# Patient Record
Sex: Female | Born: 1990
Health system: Southern US, Community
[De-identification: ages and names within clinical notes are randomized; demographics above are authoritative.]

## PROBLEM LIST (undated history)

## (undated) DIAGNOSIS — F419 Anxiety disorder, unspecified: Secondary | ICD-10-CM

## (undated) DIAGNOSIS — K219 Gastro-esophageal reflux disease without esophagitis: Secondary | ICD-10-CM

## (undated) DIAGNOSIS — I10 Essential (primary) hypertension: Secondary | ICD-10-CM

## (undated) HISTORY — DX: Essential (primary) hypertension: I10

---
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
## 2009-01-23 ENCOUNTER — Inpatient Hospital Stay: Payer: Self-pay | Admitting: Obstetrics and Gynecology

## 2009-06-13 ENCOUNTER — Emergency Department: Payer: Self-pay | Admitting: Emergency Medicine

## 2014-08-26 DIAGNOSIS — E669 Obesity, unspecified: Secondary | ICD-10-CM | POA: Insufficient documentation

## 2014-11-25 ENCOUNTER — Encounter: Payer: Self-pay | Admitting: Medical Oncology

## 2014-11-25 ENCOUNTER — Emergency Department
Admission: EM | Admit: 2014-11-25 | Discharge: 2014-11-25 | Disposition: A | Discharge status: Home / Self Care | Payer: BLUE CROSS/BLUE SHIELD | Attending: Emergency Medicine | Admitting: Emergency Medicine

## 2014-11-25 DIAGNOSIS — Z3202 Encounter for pregnancy test, result negative: Secondary | ICD-10-CM | POA: Insufficient documentation

## 2014-11-25 DIAGNOSIS — Z720 Tobacco use: Secondary | ICD-10-CM | POA: Diagnosis not present

## 2014-11-25 DIAGNOSIS — R110 Nausea: Secondary | ICD-10-CM | POA: Insufficient documentation

## 2014-11-25 DIAGNOSIS — R1030 Lower abdominal pain, unspecified: Secondary | ICD-10-CM | POA: Diagnosis not present

## 2014-11-25 DIAGNOSIS — R109 Unspecified abdominal pain: Secondary | ICD-10-CM

## 2014-11-25 HISTORY — DX: Gastro-esophageal reflux disease without esophagitis: K21.9

## 2014-11-25 LAB — COMPREHENSIVE METABOLIC PANEL
ALT: 11 U/L — ABNORMAL LOW (ref 14–54)
AST: 19 U/L (ref 15–41)
Albumin: 4.7 g/dL (ref 3.5–5.0)
Alkaline Phosphatase: 125 U/L (ref 38–126)
Anion gap: 8 (ref 5–15)
BUN: 8 mg/dL (ref 6–20)
CALCIUM: 9.2 mg/dL (ref 8.9–10.3)
CO2: 24 mmol/L (ref 22–32)
Chloride: 105 mmol/L (ref 101–111)
Creatinine, Ser: 0.73 mg/dL (ref 0.44–1.00)
GFR calc Af Amer: >60 mL/min (ref >60)
Glucose, Bld: 83 mg/dL (ref 65–99)
Potassium: 3.7 mmol/L (ref 3.5–5.1)
Sodium: 137 mmol/L (ref 135–145)
TOTAL PROTEIN: 8.3 g/dL — AB (ref 6.5–8.1)
Total Bilirubin: 1 mg/dL (ref 0.3–1.2)

## 2014-11-25 LAB — URINALYSIS COMPLETE WITH MICROSCOPIC (ARMC ONLY)
BILIRUBIN URINE: NEGATIVE
Glucose, UA: NEGATIVE mg/dL
Nitrite: NEGATIVE
PH: 7 (ref 5.0–8.0)
Protein, ur: 30 mg/dL — AB
SPECIFIC GRAVITY, URINE: 1.026 (ref 1.005–1.030)

## 2014-11-25 LAB — CBC WITH DIFFERENTIAL/PLATELET
BASOS PCT: 1 %
Basophils Absolute: 0 10*3/uL (ref 0–0.1)
EOS PCT: 1 %
Eosinophils Absolute: 0.1 10*3/uL (ref 0–0.7)
HEMATOCRIT: 41.6 % (ref 35.0–47.0)
Hemoglobin: 13.7 g/dL (ref 12.0–16.0)
LYMPHS PCT: 34 %
Lymphs Abs: 2.1 10*3/uL (ref 1.0–3.6)
MCH: 28.2 pg (ref 26.0–34.0)
MCHC: 33 g/dL (ref 32.0–36.0)
MCV: 85.5 fL (ref 80.0–100.0)
MONO ABS: 0.6 10*3/uL (ref 0.2–0.9)
Monocytes Relative: 9 %
NEUTROS ABS: 3.4 10*3/uL (ref 1.4–6.5)
Neutrophils Relative %: 55 %
Platelets: 274 10*3/uL (ref 150–440)
RBC: 4.86 MIL/uL (ref 3.80–5.20)
RDW: 12.5 % (ref 11.5–14.5)
WBC: 6.2 10*3/uL (ref 3.6–11.0)

## 2014-11-25 MED ORDER — DICYCLOMINE HCL 10 MG PO CAPS: 20.0000 mg | ORAL_CAPSULE | Freq: Once | ORAL | Status: AC

## 2014-11-25 MED ORDER — DICYCLOMINE HCL 10 MG PO CAPS: 10.0000 mg | ORAL_CAPSULE | Freq: Once | ORAL | Status: DC

## 2014-11-25 MED ORDER — DICYCLOMINE HCL 20 MG PO TABS: 20.0000 mg | ORAL_TABLET | Freq: Three times a day (TID) | ORAL | Status: DC | PRN

## 2014-11-25 MED ORDER — DICYCLOMINE HCL 20 MG PO TABS
ORAL_TABLET | ORAL | Status: AC
  Administered 2014-11-25: 20 mg
  Filled 2014-11-25: qty 1

## 2014-11-25 NOTE — ED Notes (Signed)
POCT urine preg = negative

## 2014-11-25 NOTE — ED Notes (Signed)
Called Lab for results of Urinalysis--per Lab test was ran and resulted at 1630. Results not showing in pt's chart. Lab sending paper copy of result to ED Main tube station.

## 2014-11-25 NOTE — Discharge Instructions (Signed)
Please seek medical attention for any high fevers, chest pain, shortness of breath, change in behavior, persistent vomiting, bloody stool or any other new or concerning symptoms.  Abdominal Pain, Women Abdominal (stomach, pelvic, or belly) pain can be caused by many things. It is important to tell your doctor:  The location of the pain.  Does it come and go or is it present all the time?  Are there things that start the pain (eating certain foods, exercise)?  Are there other symptoms associated with the pain (fever, nausea, vomiting, diarrhea)? All of this is helpful to know when trying to find the cause of the pain. CAUSES   Stomach: virus or bacteria infection, or ulcer.  Intestine: appendicitis (inflamed appendix), regional ileitis (Crohn's disease), ulcerative colitis (inflamed colon), irritable bowel syndrome, diverticulitis (inflamed diverticulum of the colon), or cancer of the stomach or intestine.  Gallbladder disease or stones in the gallbladder.  Kidney disease, kidney stones, or infection.  Pancreas infection or cancer.  Fibromyalgia (pain disorder).  Diseases of the female organs:  Uterus: fibroid (non-cancerous) tumors or infection.  Fallopian tubes: infection or tubal pregnancy.  Ovary: cysts or tumors.  Pelvic adhesions (scar tissue).  Endometriosis (uterus lining tissue growing in the pelvis and on the pelvic organs).  Pelvic congestion syndrome (female organs filling up with blood just before the menstrual period).  Pain with the menstrual period.  Pain with ovulation (producing an egg).  Pain with an IUD (intrauterine device, birth control) in the uterus.  Cancer of the female organs.  Functional pain (pain not caused by a disease, may improve without treatment).  Psychological pain.  Depression. DIAGNOSIS  Your doctor will decide the seriousness of your pain by doing an examination.  Blood tests.  X-rays.  Ultrasound.  CT scan  (computed tomography, special type of X-ray).  MRI (magnetic resonance imaging).  Cultures, for infection.  Barium enema (dye inserted in the large intestine, to better view it with X-rays).  Colonoscopy (looking in intestine with a lighted tube).  Laparoscopy (minor surgery, looking in abdomen with a lighted tube).  Major abdominal exploratory surgery (looking in abdomen with a large incision). TREATMENT  The treatment will depend on the cause of the pain.   Many cases can be observed and treated at home.  Over-the-counter medicines recommended by your caregiver.  Prescription medicine.  Antibiotics, for infection.  Birth control pills, for painful periods or for ovulation pain.  Hormone treatment, for endometriosis.  Nerve blocking injections.  Physical therapy.  Antidepressants.  Counseling with a psychologist or psychiatrist.  Minor or major surgery. HOME CARE INSTRUCTIONS   Do not take laxatives, unless directed by your caregiver.  Take over-the-counter pain medicine only if ordered by your caregiver. Do not take aspirin because it can cause an upset stomach or bleeding.  Try a clear liquid diet (broth or water) as ordered by your caregiver. Slowly move to a bland diet, as tolerated, if the pain is related to the stomach or intestine.  Have a thermometer and take your temperature several times a day, and record it.  Bed rest and sleep, if it helps the pain.  Avoid sexual intercourse, if it causes pain.  Avoid stressful situations.  Keep your follow-up appointments and tests, as your caregiver orders.  If the pain does not go away with medicine or surgery, you may try:  Acupuncture.  Relaxation exercises (yoga, meditation).  Group therapy.  Counseling. SEEK MEDICAL CARE IF:   You notice certain foods cause stomach  pain.  Your home care treatment is not helping your pain.  You need stronger pain medicine.  You want your IUD removed.  You  feel faint or lightheaded.  You develop nausea and vomiting.  You develop a rash.  You are having side effects or an allergy to your medicine. SEEK IMMEDIATE MEDICAL CARE IF:   Your pain does not go away or gets worse.  You have a fever.  Your pain is felt only in portions of the abdomen. The right side could possibly be appendicitis. The left lower portion of the abdomen could be colitis or diverticulitis.  You are passing blood in your stools (bright red or black tarry stools, with or without vomiting).  You have blood in your urine.  You develop chills, with or without a fever.  You pass out. MAKE SURE YOU:   Understand these instructions.  Will watch your condition.  Will get help right away if you are not doing well or get worse. Document Released: 05/01/2007 Document Revised: 11/18/2013 Document Reviewed: 05/21/2009 [facility] Patient Information 2015 [place], [place]. This information is not intended to replace advice given to you by your health care provider. Make sure you discuss any questions you have with your health care provider.

## 2014-11-25 NOTE — ED Notes (Signed)
Pt sent over from [facility] for further eval of possible appendicitis.

## 2014-11-25 NOTE — ED Notes (Signed)
Pt reports that she has been having lower abd pain and lower back pain off and on for a few days. Denies dysuria, denies fever. Reports nausea without vomiting/diarrhea.

## 2014-11-25 NOTE — ED Provider Notes (Signed)
[facility] Emergency Department Provider Note  ____________________________________________  Time seen: 9 I have reviewed the triage vital signs and the nursing notes.   HISTORY  Chief Complaint Abdominal Pain and Flank Pain   History limited by: Not Limited   HPI Tina Young is a 24 y.o. female resents to the emergency department today because of lower abdominal pain and cramping. Patient states the symptoms going on over the past couple of days. It is accompanied with some nausea however the patient denies any true vomiting. Patient denies any diarrhea or bloody stool. Patient denies any fever. The patient had similar episodes of pain in the past couple of months. They're usually short lived and lasts only a couple of days. Patient states she has an appetite and would like to eat.     Past Medical History  Diagnosis Date  . GERD (gastroesophageal reflux disease)     There are no active problems to display for this patient.   Past Surgical History  Procedure Laterality Date  . Cesarean section      Current Outpatient Rx  Name  Route  Sig  Dispense  Refill  . dicyclomine (BENTYL) 20 MG tablet   Oral   Take 1 tablet (20 mg total) by mouth 3 (three) times daily as needed for spasms (abd pain).   30 tablet   0     Allergies Review of patient's allergies indicates no known allergies.  No family history on file.  Social History History  Substance Use Topics  . Smoking status: Current Some Day Smoker  . Smokeless tobacco: Not on file  . Alcohol Use: Yes     Comment: socially    Review of Systems  Constitutional: Negative for fever. Cardiovascular: Negative for chest pain. Respiratory: Negative for shortness of breath. Gastrointestinal: Positive for lower abdominal pain and nausea Genitourinary: Negative for dysuria. Musculoskeletal: Negative for back pain. Skin: Negative for rash. Neurological: Negative for headaches,  focal weakness or numbness.  10-point ROS otherwise negative.  ____________________________________________   PHYSICAL EXAM:  VITAL SIGNS: ED Triage Vitals  Enc Vitals Group     BP 11/25/14 1618 136/82 mmHg     Pulse Rate 11/25/14 1618 82     Resp 11/25/14 1618 18     Temp 11/25/14 1618 98.5 F (36.9 C)     Temp Source 11/25/14 1618 Oral     SpO2 11/25/14 1618 100 %     Weight 11/25/14 1618 152 lb (68.947 kg)     Height 11/25/14 1618 5\' 2"  (1.575 m)     Head Cir --      Peak Flow --      Pain Score 11/25/14 1618 8   Constitutional: Alert and oriented. Well appearing and in no distress. Eyes: Conjunctivae are normal. PERRL. Normal extraocular movements. ENT   Head: Normocephalic and atraumatic.   Nose: No congestion/rhinnorhea.   Mouth/Throat: Mucous membranes are moist.   Neck: No stridor. Hematological/Lymphatic/Immunilogical: No cervical lymphadenopathy. Cardiovascular: Normal rate, regular rhythm.  No murmurs, rubs, or gallops. Respiratory: Normal respiratory effort without tachypnea nor retractions. Breath sounds are clear and equal bilaterally. No wheezes/rales/rhonchi. Gastrointestinal: Soft and minimally tender to palpation about the lower abdomen. No rebound, no guarding. Genitourinary: Deferred Musculoskeletal: Normal range of motion in all extremities. No joint effusions.  No lower extremity tenderness nor edema. Neurologic:  Normal speech and language. No gross focal neurologic deficits are appreciated. Speech is normal.  Skin:  Skin is warm, dry and intact.  No rash noted. Psychiatric: Mood and affect are normal. Speech and behavior are normal. Patient exhibits appropriate insight and judgment.  ____________________________________________    LABS (pertinent positives/negatives)  Labs Reviewed  COMPREHENSIVE METABOLIC PANEL - Abnormal; Notable for the following:    Total Protein 8.3 (*)    ALT 11 (*)    All other components within normal  limits  URINE CULTURE  CBC WITH DIFFERENTIAL/PLATELET  URINALYSIS COMPLETEWITH MICROSCOPIC ([facility])   POC URINE PREG, ED     ____________________________________________   EKG  None  ____________________________________________    RADIOLOGY  None  ____________________________________________   PROCEDURES  Procedure(s) performed: None  Critical Care performed: No  ____________________________________________   INITIAL IMPRESSION / ASSESSMENT AND PLAN / ED COURSE  Pertinent labs & imaging results that were available during my care of the patient were reviewed by me and considered in my medical decision making (see chart for details).  Patient presents with lower abdominal cramping for the past 1-2 days. Patient has had similar episodes of pain in the past. Exam is overall reassuring with only mild tenderness without any rebound or guarding. Additionally patient does not have a leukocytosis and the blood work is reassuring. UA without obvious infection, however it was a dirty sample - will send for culture.  Patient states she feels improved after bentyl. Will discharge home with a prescription. Discussed return precautions.  ____________________________________________   FINAL CLINICAL IMPRESSION(S) / ED DIAGNOSES  Final diagnoses:  Abdominal pain, unspecified abdominal location     Nance Pear, MD 11/25/14 2004

## 2015-09-29 DIAGNOSIS — K439 Ventral hernia without obstruction or gangrene: Secondary | ICD-10-CM | POA: Insufficient documentation

## 2016-11-09 LAB — HM PAP SMEAR: HM Pap smear: NEGATIVE

## 2018-03-30 ENCOUNTER — Emergency Department: Payer: Self-pay

## 2018-03-30 ENCOUNTER — Encounter: Payer: Self-pay | Admitting: Emergency Medicine

## 2018-03-30 ENCOUNTER — Other Ambulatory Visit: Payer: Self-pay

## 2018-03-30 DIAGNOSIS — R0789 Other chest pain: Secondary | ICD-10-CM | POA: Insufficient documentation

## 2018-03-30 DIAGNOSIS — F17210 Nicotine dependence, cigarettes, uncomplicated: Secondary | ICD-10-CM | POA: Insufficient documentation

## 2018-03-30 LAB — BASIC METABOLIC PANEL
Anion gap: 8 (ref 5–15)
BUN: 8 mg/dL (ref 6–20)
CO2: 21 mmol/L — ABNORMAL LOW (ref 22–32)
Calcium: 9 mg/dL (ref 8.9–10.3)
Chloride: 105 mmol/L (ref 98–111)
Creatinine, Ser: 0.75 mg/dL (ref 0.44–1.00)
Glucose, Bld: 98 mg/dL (ref 70–99)
POTASSIUM: 3.6 mmol/L (ref 3.5–5.1)
SODIUM: 134 mmol/L — AB (ref 135–145)

## 2018-03-30 LAB — CBC
HCT: 37.6 % (ref 35.0–47.0)
Hemoglobin: 12.7 g/dL (ref 12.0–16.0)
MCH: 28.3 pg (ref 26.0–34.0)
MCHC: 33.8 g/dL (ref 32.0–36.0)
MCV: 83.8 fL (ref 80.0–100.0)
PLATELETS: 307 10*3/uL (ref 150–440)
RBC: 4.49 MIL/uL (ref 3.80–5.20)
RDW: 12.4 % (ref 11.5–14.5)
WBC: 11.9 10*3/uL — ABNORMAL HIGH (ref 3.6–11.0)

## 2018-03-30 LAB — TROPONIN I: Troponin I: <0.03 ng/mL (ref <0.03)

## 2018-03-30 LAB — POCT PREGNANCY, URINE: Preg Test, Ur: NEGATIVE
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
## 2018-03-31 ENCOUNTER — Emergency Department
Admission: EM | Admit: 2018-03-31 | Discharge: 2018-03-31 | Disposition: A | Discharge status: Home / Self Care | Payer: Self-pay | Attending: Emergency Medicine | Admitting: Emergency Medicine

## 2018-03-31 DIAGNOSIS — R0789 Other chest pain: Secondary | ICD-10-CM

## 2018-03-31 LAB — TROPONIN I: Troponin I: <0.03 ng/mL (ref <0.03)

## 2018-03-31 MED ORDER — KETOROLAC TROMETHAMINE 30 MG/ML IJ SOLN
30.0000 mg | Freq: Once | INTRAMUSCULAR | Status: AC
  Administered 2018-03-31: 30 mg via INTRAMUSCULAR
  Filled 2018-03-31: qty 1
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
## 2019-02-05 DIAGNOSIS — Z6841 Body Mass Index (BMI) 40.0 and over, adult: Secondary | ICD-10-CM

## 2019-02-05 DIAGNOSIS — K439 Ventral hernia without obstruction or gangrene: Secondary | ICD-10-CM

## 2019-02-07 ENCOUNTER — Other Ambulatory Visit: Payer: Self-pay

## 2019-02-07 ENCOUNTER — Ambulatory Visit (LOCAL_COMMUNITY_HEALTH_CENTER): Payer: Self-pay

## 2019-02-07 VITALS — BP 127/88 | Ht 62.0 in | Wt 215.5 lb

## 2019-02-07 DIAGNOSIS — Z30013 Encounter for initial prescription of injectable contraceptive: Secondary | ICD-10-CM

## 2019-02-07 DIAGNOSIS — Z3009 Encounter for other general counseling and advice on contraception: Secondary | ICD-10-CM

## 2019-02-07 MED ORDER — MEDROXYPROGESTERONE ACETATE 150 MG/ML IM SUSP
150.0000 mg | Freq: Once | INTRAMUSCULAR | Status: AC
  Administered 2019-02-07: 16:00:00 150 mg via INTRAMUSCULAR

## 2019-02-07 MED ORDER — MULTI-VITAMIN/MINERALS PO TABS: 1.0000 | ORAL_TABLET | Freq: Every day | ORAL | 0 refills | Status: DC

## 2019-02-07 NOTE — Progress Notes (Signed)
Depo administered without difficulty per standing order written by Lauretta Chester (physical due 12/17/2018). Client tolerated without difficulty.

## 2019-05-08 ENCOUNTER — Ambulatory Visit (LOCAL_COMMUNITY_HEALTH_CENTER): Payer: Self-pay

## 2019-05-08 ENCOUNTER — Other Ambulatory Visit: Payer: Self-pay

## 2019-05-08 VITALS — BP 122/83 | Ht 62.0 in | Wt 217.5 lb

## 2019-05-08 DIAGNOSIS — Z3009 Encounter for other general counseling and advice on contraception: Secondary | ICD-10-CM

## 2019-05-08 DIAGNOSIS — Z3042 Encounter for surveillance of injectable contraceptive: Secondary | ICD-10-CM

## 2019-05-08 MED ORDER — MEDROXYPROGESTERONE ACETATE 150 MG/ML IM SUSP
150.0000 mg | Freq: Once | INTRAMUSCULAR | Status: AC
  Administered 2019-05-08: 150 mg via INTRAMUSCULAR

## 2019-05-08 MED ORDER — MULTIVITAMINS PO CAPS: 1.0000 | ORAL_CAPSULE | Freq: Every day | ORAL | 0 refills | Status: DC

## 2019-05-08 NOTE — Progress Notes (Signed)
Last physical at [facility] was 12/15/2017. Per [facility] records 11/09/16 PAP was NIL and HPV test not done-did not meet criteria; Needs PAP 4/21 and last CBE 4/18. Last depo at [facility] was 02/07/2019 (per standing order by Dr. Ernestina Patches). Consulted with provider regarding pt request to continue with depo; per verbal order by Antoine Primas, PA, pt can have DMPA 150 mg IM q 11-13 weeks x3 (1st one today). DMPA 150 mg IM administered today.

## 2019-05-08 NOTE — Addendum Note (Signed)
Addended by: Doy Mince on: 05/08/2019 03:48 PM   Modules accepted: Orders

## 2019-07-31 ENCOUNTER — Other Ambulatory Visit: Payer: Self-pay

## 2019-07-31 ENCOUNTER — Encounter: Payer: Self-pay | Admitting: Family Medicine

## 2019-07-31 ENCOUNTER — Ambulatory Visit: Payer: Self-pay

## 2019-07-31 ENCOUNTER — Ambulatory Visit (LOCAL_COMMUNITY_HEALTH_CENTER): Payer: Self-pay | Admitting: Family Medicine

## 2019-07-31 VITALS — BP 142/98 | Ht 62.0 in | Wt 219.4 lb

## 2019-07-31 DIAGNOSIS — A599 Trichomoniasis, unspecified: Secondary | ICD-10-CM

## 2019-07-31 DIAGNOSIS — F419 Anxiety disorder, unspecified: Secondary | ICD-10-CM

## 2019-07-31 DIAGNOSIS — F329 Major depressive disorder, single episode, unspecified: Secondary | ICD-10-CM

## 2019-07-31 DIAGNOSIS — Z01419 Encounter for gynecological examination (general) (routine) without abnormal findings: Secondary | ICD-10-CM

## 2019-07-31 DIAGNOSIS — N63 Unspecified lump in unspecified breast: Secondary | ICD-10-CM

## 2019-07-31 DIAGNOSIS — Z30013 Encounter for initial prescription of injectable contraceptive: Secondary | ICD-10-CM

## 2019-07-31 DIAGNOSIS — Z3042 Encounter for surveillance of injectable contraceptive: Secondary | ICD-10-CM

## 2019-07-31 DIAGNOSIS — I10 Essential (primary) hypertension: Secondary | ICD-10-CM

## 2019-07-31 LAB — WET PREP FOR TRICH, YEAST, CLUE
Clue Cell Exam: POSITIVE — AB
Trichomonas Exam: POSITIVE — AB
Yeast Exam: NEGATIVE

## 2019-07-31 MED ORDER — METRONIDAZOLE 500 MG PO TABS: 500.0000 mg | ORAL_TABLET | Freq: Two times a day (BID) | ORAL | 0 refills | Status: AC

## 2019-07-31 MED ORDER — MEDROXYPROGESTERONE ACETATE 150 MG/ML IM SUSP
150.0000 mg | INTRAMUSCULAR | Status: AC
  Administered 2019-07-31 – 2020-05-04 (×4): 150 mg via INTRAMUSCULAR

## 2019-07-31 MED ORDER — THERA VITAL M PO TABS: 1.0000 | ORAL_TABLET | Freq: Every day | ORAL | 0 refills | Status: AC

## 2019-07-31 NOTE — Progress Notes (Signed)
In for physical and Depo; declines HIV/RPR testing Debera Lat, RN Wet prep reviewed- +Trich & +BV-treated per standing order Debera Lat, RN

## 2019-07-31 NOTE — Progress Notes (Signed)
Family Planning Visit- Initial Visit  Subjective:  Tina Young is a 29 y.o. being seen today for an initial well woman visit and to discuss family planning options.  She is currently using Depo-Provera injections for pregnancy prevention. Patient reports she does not want a pregnancy in the next year.  Patient has the following medical conditions has Hernia of anterior abdominal wall; Obesity, unspecified; Hypertension; and Anxiety on their problem list.  No chief complaint on file.   Patient reports she is here for physical, depo and pap. She has been on depo on/off since 2010.   Pt denies all of the following, which are contraindications to Depo use: Known breast cancer Pregnancy Also denies: Severe Hypertension - on BP medication for HTN Severe cirrhosis, hepatocellular adenoma Diabetes with nephrosis or vascular complications Ischemic heart disease or multiple risk factors for atherosclerotic disease, and some forms of lupus Unexplained vaginal bleeding Pregnancy planned within the next year Long-term use of corticosteroid therapy in women with a history of, or risk factors for, nontraumatic (frailty) fractures.  Current use of aminoglutethimide (usually for the treatment of Cushing's syndrome) because aminoglutethimide may increase metabolism of progestins   Has had vaginal d/c and itching, occ brown, feels irritated x a few months. Requests STI testing.  States she has felt little interest in doing things, down/depressed. She has known dx of anxiety, struggles with depression. Has good support from family, states she has been doing better the past few weeks. Has spoken w/PCP about this, takes atarax prn.  Last pap: 11/10/2016: NIL, due soon No LMP recorded. Patient has had an injection.  Body mass index is 40.13 kg/m. - Patient is eligible for diabetes screening based on BMI and age >9?  no HA1C ordered? not applicable  Patient reports 1 of partners in last year.  Desires STI screening?  Yes  Does the patient have a current or past history of drug use? No   No components found for: HCV]   Health Maintenance Due  Topic Date Due  . HIV Screening  04/09/2005  . TETANUS/TDAP  04/09/2009  . INFLUENZA VACCINE  02/16/2019    ROS ROS + for Migraines: do not come frequently, maybe assoc w/stress - advised PCP f/u, declines beh health Heat/cold intol: stays cold often, takes iron PRN - advised PCP f/u Vision problems: has eye appt, will get checked out SOB/chest discomfort: assoc w/anxiety, has spoken w/PCP about this.  The following portions of the patient's history were reviewed and updated as appropriate: allergies, current medications, past family history, past medical history, past social history, past surgical history and problem list. Problem list updated.   See flowsheet for other program required questions.  Objective:   Today's Vitals   07/31/19 1426 07/31/19 1500  BP: (!) 144/92 (!) 142/98  Weight: 219 lb 6.4 oz (99.5 kg)   Height: 5\' 2"  (1.575 m)    Body mass index is 40.13 kg/m.  Physical Exam Vitals and nursing note reviewed.  Constitutional:      Appearance: Normal appearance.  HENT:     Head: Normocephalic and atraumatic.     Mouth/Throat:     Mouth: Mucous membranes are moist.     Pharynx: Oropharynx is clear. No oropharyngeal exudate or posterior oropharyngeal erythema.  Eyes:     Conjunctiva/sclera: Conjunctivae normal.  Neck:     Thyroid: No thyroid mass, thyromegaly or thyroid tenderness.  Cardiovascular:     Rate and Rhythm: Normal rate and regular rhythm.  Pulses: Normal pulses.     Heart sounds: Normal heart sounds.  Pulmonary:     Effort: Pulmonary effort is normal.     Breath sounds: Normal breath sounds.  Chest:     Breasts:        Right: Normal. No swelling, mass, nipple discharge, skin change or tenderness.        Left: ~.5cm nodule at 12:00. No swelling, nipple discharge, skin change or  tenderness.  Abdominal:     General: Abdomen is flat.     Palpations: There is no mass.     Tenderness: There is no abdominal tenderness. There is no rebound.  Genitourinary:     General: Normal vulva.     Exam position: Lithotomy position.     Pubic Area: No rash or pubic lice.      Labia:        Right: No rash or lesion.        Left: No rash or lesion.      Vagina: Normal. No erythema, bleeding or lesions. +white creamy discharge, ph>4.5    Cervix: No cervical motion tenderness, discharge, friability, lesion or erythema.     Uterus: Normal.      Adnexa: Right adnexa normal and left adnexa normal.     Rectum: Normal.  Lymphadenopathy:     Head:     Right side of head: No preauricular or posterior auricular adenopathy.     Left side of head: No preauricular or posterior auricular adenopathy.     Cervical: No cervical adenopathy.     Upper Body:     Right upper body: No supraclavicular or axillary adenopathy.     Left upper body: No supraclavicular or axillary adenopathy.     Lower Body: No right inguinal adenopathy. No left inguinal adenopathy.  Skin:    General: Skin is warm and dry.     Findings: No rash.  Neurological:     Mental Status: She is alert and oriented to person, place, and time.     Assessment and Plan:  Tina Young is a 29 y.o. female presenting to the [facility] Department for an initial well woman exam/family planning visit  Contraception counseling: Reviewed all forms of birth control options in the tiered based approach. available including abstinence; over the counter/barrier methods; hormonal contraceptive medication including pill, patch, ring, injection,contraceptive implant; hormonal and nonhormonal IUDs; permanent sterilization options including vasectomy and the various tubal sterilization modalities. Risks, benefits, and typical effectiveness rates were reviewed.  Questions were answered.  Written information was also given to the  patient to review.  Patient desires depo, this was prescribed for patient. She will follow up in  3 months for surveillance.  She was told to call with any further questions, or with any concerns about this method of contraception.  Emphasized use of condoms 100% of the time for STI prevention.  ECP: n/a, continuous use of depo    1. Well woman exam -Physical today w/pap.  -Pt accept GC/Chlamydia screens, declines HIV and syphilis. Does not qualify for HCV, HBV screenings. - Multiple Vitamins-Minerals (MULTIVITAMIN) tablet; Take 1 tablet by mouth daily.  Dispense: 100 tablet; Refill: 0 - IGP, rfx [facility] HPV ASCU - Chlamydia/Gonorrhea [facility] - WET PREP FOR [place], YEAST, CLUE  2. Encounter for surveillance of injectable contraceptive -Depo rx x 1 yr. Counseling as above. -Risks of Depo use >2 yrs discussed with pt. Alternative BCM discussed, pt would like to continue Depo. Suggested  calcium (1200 mg daily) and vitamin D (800 IU daily) supplements, weight bearing exercise and avoiding cigarette smoking and excessive alcohol consumption to promote bone health.  -Follow up in 3 months for repeat Depo Provera injection.  - medroxyPROGESTERone (DEPO-PROVERA) injection 150 mg  3. Hypertension, unspecified type -BP elevated today. She has known dx of HTN, taking propranolol. Advised to call PCP asap for appt and possible medication adjustment. Discussed risks of uncontrolled HTN with depo.   4. Anxiety and depression -Pt endorses anxiety and depression on intake form. Offered beh health referral which pt declines for now, states she is doing ok, has spoken w/PCP. No si/hi but to go to ER if present.  5. Breast nodule -Small nodule, ~.5cm at 12:00 L breast. Pt states has been constant, present for unknown number of years. No family hx breast cancer. Will refer to [facility] for further investigation.    Return in about 3 months (around 10/29/2019) for Depo.  No future appointments.  Kandee Keen, PA-C

## 2019-08-05 LAB — IGP, RFX APTIMA HPV ASCU: PAP Smear Comment: 0

## 2019-11-13 ENCOUNTER — Other Ambulatory Visit: Payer: Self-pay

## 2019-11-13 ENCOUNTER — Ambulatory Visit (LOCAL_COMMUNITY_HEALTH_CENTER): Payer: 59

## 2019-11-13 VITALS — BP 139/94 | Ht 62.0 in | Wt 211.2 lb

## 2019-11-13 DIAGNOSIS — Z30019 Encounter for initial prescription of contraceptives, unspecified: Secondary | ICD-10-CM

## 2019-11-13 DIAGNOSIS — Z3009 Encounter for other general counseling and advice on contraception: Secondary | ICD-10-CM | POA: Diagnosis not present

## 2019-11-13 DIAGNOSIS — Z3042 Encounter for surveillance of injectable contraceptive: Secondary | ICD-10-CM

## 2019-11-13 MED ORDER — MEDROXYPROGESTERONE ACETATE 150 MG/ML IM SUSP: 150.0000 mg | Freq: Once | INTRAMUSCULAR | Status: DC

## 2019-11-13 NOTE — Progress Notes (Signed)
Patient here today for Depo. 15.0 weeks post Depo. Last PE and Pap was 07/31/2019. BP today is 139/94. Consulted with Ola Spurr, CNM for same. Ok to administer Depo today and recommended that patient follow up with PCP regarding current BP medication not controlling BP. Patient agreeable to recommendations. Depo administered per 07/31/2019 of J. Staples, PA-C. Hal Morales, RN

## 2019-11-26 ENCOUNTER — Encounter: Payer: Self-pay | Admitting: Emergency Medicine

## 2019-11-26 ENCOUNTER — Ambulatory Visit
Admission: EM | Admit: 2019-11-26 | Discharge: 2019-11-26 | Disposition: A | Discharge status: Home / Self Care | Payer: 59 | Attending: Emergency Medicine | Admitting: Emergency Medicine

## 2019-11-26 ENCOUNTER — Other Ambulatory Visit: Payer: Self-pay

## 2019-11-26 DIAGNOSIS — M549 Dorsalgia, unspecified: Secondary | ICD-10-CM | POA: Diagnosis not present

## 2019-11-26 DIAGNOSIS — R030 Elevated blood-pressure reading, without diagnosis of hypertension: Secondary | ICD-10-CM

## 2019-11-26 HISTORY — DX: Anxiety disorder, unspecified: F41.9

## 2019-11-26 MED ORDER — IBUPROFEN 600 MG PO TABS
600.0000 mg | ORAL_TABLET | Freq: Four times a day (QID) | ORAL | 0 refills | Status: DC | PRN
Start: 2019-11-26 — End: 2023-02-10

## 2019-11-26 MED ORDER — CYCLOBENZAPRINE HCL 10 MG PO TABS
10.0000 mg | ORAL_TABLET | Freq: Two times a day (BID) | ORAL | 0 refills | Status: DC | PRN
Start: 2019-11-26 — End: 2023-02-10

## 2019-11-26 NOTE — ED Provider Notes (Signed)
Roderic Palau    CSN: [id number] Arrival date & time: 11/26/19  1623      History   Chief Complaint Chief Complaint  Patient presents with  . Nasal Congestion  . Back Pain    HPI Tina Young is a 29 y.o. female.   Patient presents with right middle back pain x2 months.  She states it feels like a "catch".  The pain is worse with movement, 6/10, aching, improves with rest.  Treatment attempted at home with Tylenol.  Patient also reports nasal congestion x1 day.  She denies fever, chills, rash, lesions, bruising, numbness, tingling, cough, shortness of breath, abdominal pain, dysuria, or other symptoms.  The history is provided by the patient.    Past Medical History:  Diagnosis Date  . Anxiety   . GERD (gastroesophageal reflux disease)   . Hypertension     Patient Active Problem List   Diagnosis Date Noted  . Hypertension 07/31/2019  . Anxiety 07/31/2019  . Hernia of anterior abdominal wall 09/29/2015  . Obesity, unspecified 08/26/2014    Past Surgical History:  Procedure Laterality Date  . CESAREAN SECTION      OB History    Gravida  1   Para  1   Term  1   Preterm      AB      Living  1     SAB      TAB      Ectopic      Multiple      Live Births  1            Home Medications    Prior to Admission medications   Medication Sig Start Date End Date Taking? Authorizing Provider  hydrOXYzine (ATARAX/VISTARIL) 10 MG tablet Take 10 mg by mouth as needed for anxiety.   Yes [provider]  Multiple Vitamin (MULTIVITAMIN) capsule Take 1 capsule by mouth daily. 05/08/19  Yes Caren Macadam, MD  propranolol (INDERAL) 10 MG tablet Take 10 mg by mouth daily. Client is unsure of dose.   Yes [provider]  cyclobenzaprine (FLEXERIL) 10 MG tablet Take 1 tablet (10 mg total) by mouth 2 (two) times daily as needed for muscle spasms. 11/26/19   Sharion Balloon, NP  ibuprofen (ADVIL) 600 MG tablet Take 1 tablet (600  mg total) by mouth every 6 (six) hours as needed. 11/26/19   Sharion Balloon, NP    Family History Family History  Problem Relation Age of Onset  . Asthma Mother   . Asthma Brother   . Diabetes Brother   . Hypertension Brother   . Asthma Son   . Hypertension Maternal Grandmother   . Hypertension Father   . Hypertension Sister     Social History Social History   Tobacco Use  . Smoking status: Former Smoker    Quit date: 11/25/2017    Years since quitting: 2.0  . Smokeless tobacco: Never Used  Substance Use Topics  . Alcohol use: Yes    Comment: socially  . Drug use: Never     Allergies   Patient has no known allergies.   Review of Systems Review of Systems  Constitutional: Negative for chills and fever.  HENT: Positive for congestion. Negative for ear pain and sore throat.   Eyes: Negative for pain and visual disturbance.  Respiratory: Negative for cough and shortness of breath.   Cardiovascular: Negative for chest pain and palpitations.  Gastrointestinal: Negative for abdominal pain,  diarrhea, nausea and vomiting.  Genitourinary: Negative for dysuria and hematuria.  Musculoskeletal: Positive for back pain. Negative for arthralgias.  Skin: Negative for color change and rash.  Neurological: Negative for seizures, syncope, weakness and numbness.  All other systems reviewed and are negative.    Physical Exam Triage Vital Signs ED Triage Vitals  Enc Vitals Group     BP 11/26/19 1639 (!) 148/92     Pulse Rate 11/26/19 1639 60     Resp 11/26/19 1639 18     Temp 11/26/19 1639 99.2 F (37.3 C)     Temp Source 11/26/19 1639 Oral     SpO2 11/26/19 1639 98 %     Weight 11/26/19 1641 211 lb (95.7 kg)     Height 11/26/19 1641 5\' 2"  (1.575 m)     Head Circumference --      Peak Flow --      Pain Score 11/26/19 1640 6     Pain Loc --      Pain Edu? --      Excl. in [place]? --    No data found.  Updated Vital Signs BP (!) 148/92 (BP Location: Right Arm)   Pulse 60    Temp 99.2 F (37.3 C) (Oral)   Resp 18   Ht 5\' 2"  (1.575 m)   Wt 211 lb (95.7 kg)   SpO2 98%   BMI 38.59 kg/m   Visual Acuity Right Eye Distance:   Left Eye Distance:   Bilateral Distance:    Right Eye Near:   Left Eye Near:    Bilateral Near:     Physical Exam Vitals and nursing note reviewed.  Constitutional:      General: She is not in acute distress.    Appearance: She is well-developed.  HENT:     Head: Normocephalic and atraumatic.     Right Ear: Tympanic membrane normal.     Left Ear: Tympanic membrane normal.     Nose: Nose normal.     Mouth/Throat:     Mouth: Mucous membranes are moist.     Pharynx: Oropharynx is clear.  Eyes:     Conjunctiva/sclera: Conjunctivae normal.  Cardiovascular:     Rate and Rhythm: Normal rate and regular rhythm.     Heart sounds: No murmur.  Pulmonary:     Effort: Pulmonary effort is normal. No respiratory distress.     Breath sounds: Normal breath sounds.  Abdominal:     Palpations: Abdomen is soft.     Tenderness: There is no abdominal tenderness. There is no guarding or rebound.  Musculoskeletal:        General: Tenderness present. No swelling or deformity. Normal range of motion.       Arms:     Cervical back: Neck supple.  Skin:    General: Skin is warm and dry.     Findings: No bruising, erythema, lesion or rash.  Neurological:     General: No focal deficit present.     Mental Status: She is alert and oriented to person, place, and time.     Sensory: No sensory deficit.     Motor: No weakness.     Gait: Gait normal.  Psychiatric:        Mood and Affect: Mood normal.        Behavior: Behavior normal.      UC Treatments / Results  Labs (all labs ordered are listed, but only abnormal results are displayed) Labs Reviewed - No  data to display  EKG   Radiology No results found.  Procedures Procedures (including critical care time)  Medications Ordered in UC Medications - No data to display  Initial  Impression / Assessment and Plan / UC Course  I have reviewed the triage vital signs and the nursing notes.  Pertinent labs & imaging results that were available during my care of the patient were reviewed by me and considered in my medical decision making (see chart for details).   Right mid back pain.  Elevated blood pressure reading.  Treating with ibuprofen and Flexeril.  Precautions for drowsiness with Flexeril discussed with patient.  Instructed her to follow-up with orthopedics if her symptoms or not improving.  Discussed that her blood pressure is elevated today needs to be rechecked by her PCP in 2 to 4 weeks.  Patient agrees to plan of care.   Final Clinical Impressions(s) / UC Diagnoses   Final diagnoses:  Mid back pain on right side  Elevated blood pressure reading     Discharge Instructions     Take the prescribed ibuprofen as needed for your pain.  Take the muscle relaxer Flexeril as needed for muscle spasm; do not drive, operate machinery, or drink alcohol with this medication as it may make you drowsy.    Follow up with an orthopedist if your pain is not improving.    Your blood pressure is elevated today at 148/92.  Please have this rechecked by your primary care provider in 2-4 weeks.         ED Prescriptions    Medication Sig Dispense Auth. Provider   ibuprofen (ADVIL) 600 MG tablet Take 1 tablet (600 mg total) by mouth every 6 (six) hours as needed. 30 tablet Sharion Balloon, NP   cyclobenzaprine (FLEXERIL) 10 MG tablet Take 1 tablet (10 mg total) by mouth 2 (two) times daily as needed for muscle spasms. 20 tablet Sharion Balloon, NP     I have reviewed the PDMP during this encounter.   Sharion Balloon, NP 11/26/19 1725

## 2019-11-26 NOTE — ED Triage Notes (Addendum)
Patient in today c/o nasal congestion x 1 day and back pain/spasm x 2 months. No injury noted. Patient states that she has changed jobs and is doing a more physical job now. Patient has used OTC sinus medication and Aleve for her symptoms.  Patient has had both [place] covid vaccines and the 2nd one was on 11/22/19.

## 2019-11-26 NOTE — Discharge Instructions (Addendum)
Take the prescribed ibuprofen as needed for your pain.  Take the muscle relaxer Flexeril as needed for muscle spasm; do not drive, operate machinery, or drink alcohol with this medication as it may make you drowsy.    Follow up with an orthopedist if your pain is not improving.    Your blood pressure is elevated today at 148/92.  Please have this rechecked by your primary care provider in 2-4 weeks.

## 2020-02-06 ENCOUNTER — Ambulatory Visit (LOCAL_COMMUNITY_HEALTH_CENTER): Payer: 59

## 2020-02-06 ENCOUNTER — Other Ambulatory Visit: Payer: Self-pay

## 2020-02-06 VITALS — BP 159/101 | Ht 62.0 in

## 2020-02-06 DIAGNOSIS — Z3009 Encounter for other general counseling and advice on contraception: Secondary | ICD-10-CM | POA: Diagnosis not present

## 2020-02-06 DIAGNOSIS — Z30013 Encounter for initial prescription of injectable contraceptive: Secondary | ICD-10-CM

## 2020-02-06 DIAGNOSIS — Z3042 Encounter for surveillance of injectable contraceptive: Secondary | ICD-10-CM

## 2020-02-06 NOTE — Progress Notes (Signed)
Last time pt took BP med was around 6:30 AM today, does not smoke, no caffeine products (drank some Sprite); pt states she noticed last week that she was having some nausea sometimes, dizzines, bad headaches, seeing specs/dots sometimes; currently has slight headache. Last time she saw physician about BP was last year, maybe in the fall; has plans to schedule appt since she now has insurance. Pt is 12.1 weeks post depo today. Consulted with provider regarding BP and pt complaints/details.  Per verbal order by Antoine Primas, PA, administered DMPA 150 mg IM today and counseled pt to proceed on to ER to be evaluated for BP and symptoms she has been experiencing. Pt expresses understanding and plans to be evaluated right away. (Ref also order by Charlotte Sanes, PA  dated 07/31/19 for 1 year depo)

## 2020-02-07 ENCOUNTER — Encounter: Payer: Self-pay | Admitting: Emergency Medicine

## 2020-02-07 ENCOUNTER — Other Ambulatory Visit: Payer: Self-pay

## 2020-02-07 ENCOUNTER — Emergency Department
Admission: EM | Admit: 2020-02-07 | Discharge: 2020-02-07 | Disposition: A | Discharge status: Home / Self Care | Payer: 59 | Attending: Emergency Medicine | Admitting: Emergency Medicine

## 2020-02-07 DIAGNOSIS — R519 Headache, unspecified: Secondary | ICD-10-CM | POA: Diagnosis not present

## 2020-02-07 DIAGNOSIS — Z5321 Procedure and treatment not carried out due to patient leaving prior to being seen by health care provider: Secondary | ICD-10-CM | POA: Insufficient documentation

## 2020-02-07 DIAGNOSIS — R42 Dizziness and giddiness: Secondary | ICD-10-CM | POA: Insufficient documentation

## 2020-02-07 DIAGNOSIS — R030 Elevated blood-pressure reading, without diagnosis of hypertension: Secondary | ICD-10-CM | POA: Diagnosis not present

## 2020-02-07 LAB — URINALYSIS, COMPLETE (UACMP) WITH MICROSCOPIC
Bacteria, UA: NONE SEEN
Bilirubin Urine: NEGATIVE
Glucose, UA: NEGATIVE mg/dL
Ketones, ur: NEGATIVE mg/dL
Nitrite: NEGATIVE
Protein, ur: NEGATIVE mg/dL
Specific Gravity, Urine: 1.026 (ref 1.005–1.030)
pH: 6 (ref 5.0–8.0)

## 2020-02-07 LAB — CBC
HCT: 38.4 % (ref 36.0–46.0)
Hemoglobin: 12.7 g/dL (ref 12.0–15.0)
MCH: 28.2 pg (ref 26.0–34.0)
MCHC: 33.1 g/dL (ref 30.0–36.0)
MCV: 85.1 fL (ref 80.0–100.0)
Platelets: 303 10*3/uL (ref 150–400)
RBC: 4.51 MIL/uL (ref 3.87–5.11)
RDW: 12.6 % (ref 11.5–15.5)
WBC: 7.1 10*3/uL (ref 4.0–10.5)
nRBC: 0 % (ref 0.0–0.2)

## 2020-02-07 LAB — BASIC METABOLIC PANEL
Anion gap: 6 (ref 5–15)
BUN: 9 mg/dL (ref 6–20)
CO2: 27 mmol/L (ref 22–32)
Calcium: 9.5 mg/dL (ref 8.9–10.3)
Chloride: 104 mmol/L (ref 98–111)
Creatinine, Ser: 0.7 mg/dL (ref 0.44–1.00)
GFR calc Af Amer: >60 mL/min (ref >60)
GFR calc non Af Amer: >60 mL/min (ref >60)
Glucose, Bld: 105 mg/dL — ABNORMAL HIGH (ref 70–99)
Potassium: 4.5 mmol/L (ref 3.5–5.1)
Sodium: 137 mmol/L (ref 135–145)

## 2020-02-07 LAB — POC URINE PREG, ED: Preg Test, Ur: NEGATIVE

## 2020-02-07 NOTE — ED Notes (Signed)
Called for repeat vs x 2.

## 2020-02-07 NOTE — ED Notes (Signed)
Called for repeat VS x 1.  

## 2020-02-07 NOTE — ED Notes (Signed)
Pt called for repeat VS x 3. Pt not visualized in ED or outside.

## 2020-02-07 NOTE — ED Triage Notes (Signed)
Pt here for dizziness and headaches for last week.  Reports bp has been up but has not spoken to doctor about possibly changing meds.  Tried tylenol today for headache without relief.

## 2020-02-10 ENCOUNTER — Telehealth: Payer: Self-pay | Admitting: Emergency Medicine

## 2020-02-10 DIAGNOSIS — Z1389 Encounter for screening for other disorder: Secondary | ICD-10-CM | POA: Diagnosis not present

## 2020-02-10 DIAGNOSIS — E559 Vitamin D deficiency, unspecified: Secondary | ICD-10-CM | POA: Diagnosis not present

## 2020-02-10 DIAGNOSIS — I10 Essential (primary) hypertension: Secondary | ICD-10-CM | POA: Diagnosis not present

## 2020-02-10 DIAGNOSIS — M62830 Muscle spasm of back: Secondary | ICD-10-CM | POA: Diagnosis not present

## 2020-02-10 NOTE — Telephone Encounter (Signed)
Called patient due to lwot to inquire about condition and follow up plans. Left message.   

## 2020-02-11 DIAGNOSIS — I10 Essential (primary) hypertension: Secondary | ICD-10-CM | POA: Diagnosis not present

## 2020-02-11 DIAGNOSIS — E559 Vitamin D deficiency, unspecified: Secondary | ICD-10-CM | POA: Diagnosis not present

## 2020-02-25 DIAGNOSIS — I10 Essential (primary) hypertension: Secondary | ICD-10-CM | POA: Diagnosis not present

## 2020-02-25 DIAGNOSIS — M62830 Muscle spasm of back: Secondary | ICD-10-CM | POA: Diagnosis not present

## 2020-05-04 ENCOUNTER — Ambulatory Visit (LOCAL_COMMUNITY_HEALTH_CENTER): Payer: 59

## 2020-05-04 ENCOUNTER — Other Ambulatory Visit: Payer: Self-pay

## 2020-05-04 VITALS — BP 142/90 | Ht 62.0 in | Wt 200.5 lb

## 2020-05-04 DIAGNOSIS — Z3042 Encounter for surveillance of injectable contraceptive: Secondary | ICD-10-CM

## 2020-05-04 DIAGNOSIS — Z3009 Encounter for other general counseling and advice on contraception: Secondary | ICD-10-CM | POA: Diagnosis not present

## 2020-05-04 NOTE — Progress Notes (Signed)
12 weeks 4 days post depo. Elevated BP today at 142/90. Pt states she has been seen by [facility]) and her BP meds have been changed and she reports taking her meds as prescribed. Consult with Criss Rosales, PA who gives ok to proceed with Depo today per original order by Earnestine Mealing, PA-C dated 07/31/2019. Pt tolerated depo well today. Depo consent signed today. Next depo due 07/21/2019, pt aware. Josie Saunders, RN

## 2020-05-05 NOTE — Progress Notes (Signed)
Consulted by RN re:  Patient with elevated BP and here for Depo.  Reviewed RN note and agree that it reflects discussion and plan of care.

## 2020-07-26 ENCOUNTER — Other Ambulatory Visit: Payer: 59

## 2020-07-26 DIAGNOSIS — Z20822 Contact with and (suspected) exposure to covid-19: Secondary | ICD-10-CM

## 2020-07-28 LAB — NOVEL CORONAVIRUS, NAA: SARS-CoV-2, NAA: DETECTED — AB

## 2020-07-28 LAB — SARS-COV-2, NAA 2 DAY TAT

## 2020-08-10 ENCOUNTER — Other Ambulatory Visit: Payer: Self-pay

## 2020-08-10 ENCOUNTER — Ambulatory Visit (LOCAL_COMMUNITY_HEALTH_CENTER): Payer: 59

## 2020-08-10 VITALS — BP 138/96 | Ht 62.0 in

## 2020-08-10 DIAGNOSIS — Z3009 Encounter for other general counseling and advice on contraception: Secondary | ICD-10-CM

## 2020-08-10 DIAGNOSIS — Z30013 Encounter for initial prescription of injectable contraceptive: Secondary | ICD-10-CM | POA: Diagnosis not present

## 2020-08-10 MED ORDER — MEDROXYPROGESTERONE ACETATE 150 MG/ML IM SUSP
150.0000 mg | Freq: Once | INTRAMUSCULAR | Status: AC
  Administered 2020-08-10: 150 mg via INTRAMUSCULAR

## 2020-08-10 NOTE — Progress Notes (Signed)
Patient is 74 w post last Depo. Consult with Antoine Primas, PA today regarding elevated blood pressure 138/96. Patient reperts taking her blood pressure med this morning and needs to see her PCP soon.  Last appointment was in the fall 2021.  Per Angela Nevin: Ok to give Depo today.  DMPA 150 mg given IM (Left Glute) today and tolerated well. Dahlia Bailiff, RN

## 2020-08-11 NOTE — Progress Notes (Signed)
Consulted by RN re: patient with elevated BP.  Verbal order given for patient to proceed with Depo today.  Rec that patient continue to take her BP med as directed and keep follow up appointment with PCP as scheduled.  Reviewed RN note and agree with documentation.

## 2020-10-16 DIAGNOSIS — M62830 Muscle spasm of back: Secondary | ICD-10-CM | POA: Diagnosis not present

## 2020-10-16 DIAGNOSIS — Z1389 Encounter for screening for other disorder: Secondary | ICD-10-CM | POA: Diagnosis not present

## 2020-10-16 DIAGNOSIS — F411 Generalized anxiety disorder: Secondary | ICD-10-CM | POA: Diagnosis not present

## 2020-11-09 ENCOUNTER — Ambulatory Visit: Payer: 59

## 2020-11-16 ENCOUNTER — Other Ambulatory Visit: Payer: Self-pay

## 2020-11-16 ENCOUNTER — Encounter: Payer: Self-pay | Admitting: Physician Assistant

## 2020-11-16 ENCOUNTER — Ambulatory Visit (LOCAL_COMMUNITY_HEALTH_CENTER): Payer: 59 | Admitting: Physician Assistant

## 2020-11-16 VITALS — BP 126/84 | Ht 62.0 in | Wt 199.4 lb

## 2020-11-16 DIAGNOSIS — A5901 Trichomonal vulvovaginitis: Secondary | ICD-10-CM

## 2020-11-16 DIAGNOSIS — Z113 Encounter for screening for infections with a predominantly sexual mode of transmission: Secondary | ICD-10-CM

## 2020-11-16 DIAGNOSIS — Z3042 Encounter for surveillance of injectable contraceptive: Secondary | ICD-10-CM | POA: Diagnosis not present

## 2020-11-16 DIAGNOSIS — Z0000 Encounter for general adult medical examination without abnormal findings: Secondary | ICD-10-CM

## 2020-11-16 DIAGNOSIS — Z3009 Encounter for other general counseling and advice on contraception: Secondary | ICD-10-CM

## 2020-11-16 LAB — WET PREP FOR TRICH, YEAST, CLUE
Trichomonas Exam: POSITIVE — AB
Yeast Exam: NEGATIVE

## 2020-11-16 MED ORDER — METRONIDAZOLE 500 MG PO TABS: 500.0000 mg | ORAL_TABLET | Freq: Two times a day (BID) | ORAL | 0 refills | Status: AC

## 2020-11-16 MED ORDER — MEDROXYPROGESTERONE ACETATE 150 MG/ML IM SUSP
150.0000 mg | INTRAMUSCULAR | Status: AC
  Administered 2020-11-16 – 2021-09-10 (×4): 150 mg via INTRAMUSCULAR

## 2020-11-16 NOTE — Progress Notes (Signed)
Wet mount results reviewed with Provider. Treatment given per orders.  Side effects and instructions on how to take Metronidazole, given to patient.  Pt informed to avoid alcohol 24 hrs prior and 72 hours after taking the Metronidazole Depo 150mg  given IM in [place] without any complications. Windle Guard, RN

## 2020-11-17 ENCOUNTER — Encounter: Payer: Self-pay | Admitting: Physician Assistant

## 2020-11-17 NOTE — Progress Notes (Signed)
[facility] Number: [phone]    Family Planning Visit- Initial Visit  Subjective:  Tina Young is a 30 y.o.  G1P1001   being seen today for an initial annual visit and to discuss contraceptive options.  The patient is currently using Depo Provera for pregnancy prevention. Patient reports she does not want a pregnancy in the next year.  Patient has the following medical conditions has Hernia of anterior abdominal wall; Obesity, unspecified; Hypertension; and Anxiety on their problem list.  Chief Complaint  Patient presents with  . Annual Exam    PE and Depo    Patient reports that she is doing well with the Depo and desires to continue with this as her BCM.  States that she had some swollen glands in her neck when she had some trouble with her wisdom tooth but this has now resolved.  Reports that she has had some vaginal itching for a few months with small amount of brownish discharge.  Reports that the lump noted on exam last year in her breast has resolved.  Per chart review, CBE and pap are due in 2024.  Patient denies any other concerns today.    Body mass index is 36.47 kg/m. - Patient is eligible for diabetes screening based on BMI and age >62?  not applicable BJ6E ordered? not applicable  Patient reports 1  partner/s in last year. Desires STI screening?  Yes  Has patient been screened once for HCV in the past?  No  No results found for: HCVAB  Does the patient have current drug use (including MJ), have a partner with drug use, and/or has been incarcerated since last result? No  If yes-- Screen for HCV through [facility]   Does the patient meet criteria for HBV testing? No  Criteria:  -Household, sexual or needle sharing contact with HBV -History of drug use -HIV positive -Those with known Hep C   Health Maintenance Due  Topic Date Due  . Hepatitis C Screening  Never done  .  COVID-19 Vaccine (1) Never done  . HIV Screening  Never done  . TETANUS/TDAP  Never done    Review of Systems  All other systems reviewed and are negative.   The following portions of the patient's history were reviewed and updated as appropriate: allergies, current medications, past family history, past medical history, past social history, past surgical history and problem list. Problem list updated.   See flowsheet for other program required questions.  Objective:   Vitals:   11/16/20 1438  BP: 126/84  Weight: 199 lb 6.4 oz (90.4 kg)  Height: 5\' 2"  (1.575 m)    Physical Exam Vitals and nursing note reviewed.  Constitutional:      General: She is not in acute distress.    Appearance: Normal appearance.  HENT:     Head: Normocephalic and atraumatic.     Mouth/Throat:     Mouth: Mucous membranes are moist.     Pharynx: Oropharynx is clear. No oropharyngeal exudate or posterior oropharyngeal erythema.  Eyes:     Conjunctiva/sclera: Conjunctivae normal.  Neck:     Thyroid: No thyroid mass or thyroid tenderness.  Cardiovascular:     Rate and Rhythm: Normal rate and regular rhythm.  Pulmonary:     Effort: Pulmonary effort is normal.     Breath sounds: Normal breath sounds.  Abdominal:     Palpations: Abdomen is soft. There is  no mass.     Tenderness: There is no abdominal tenderness. There is no guarding or rebound.  Musculoskeletal:     Cervical back: Neck supple. No tenderness.  Lymphadenopathy:     Cervical: No cervical adenopathy.  Skin:    General: Skin is warm and dry.     Findings: No bruising, erythema, lesion or rash.  Neurological:     Mental Status: She is alert and oriented to person, place, and time.  Psychiatric:        Mood and Affect: Mood normal.        Behavior: Behavior normal.        Thought Content: Thought content normal.        Judgment: Judgment normal.       Assessment and Plan:  Tina Young is a 30 y.o. female presenting to  the [facility] for an initial annual wellness/contraceptive visit  Contraception counseling: Reviewed all forms of birth control options in the tiered based approach. available including abstinence; over the counter/barrier methods; hormonal contraceptive medication including pill, patch, ring, injection,contraceptive implant, ECP; hormonal and nonhormonal IUDs; permanent sterilization options including vasectomy and the various tubal sterilization modalities. Risks, benefits, and typical effectiveness rates were reviewed.  Questions were answered.  Written information was also given to the patient to review.  Patient desires to continue with Depo, this was prescribed for patient. She will follow up in  3 months and prn for surveillance.  She was told to call with any further questions, or with any concerns about this method of contraception.  Emphasized use of condoms 100% of the time for STI prevention.  Patient was not a candidate for ECP today.  1. Encounter for counseling regarding contraception Reviewed normal SE of Depo and when to call clinic with concerns. Enc condoms with all sex for STD protection.   2. Screening for STD (sexually transmitted disease) Await test results.  Counseled that RN will call if needs to RTC for treatment once results are back.  - WET PREP FOR [place], YEAST, [facility]  3. Well woman exam (no gynecological exam) Reviewed with patient healthy habits to maintain general health. Enc calcium with Vitamin D3 and weight bearing exercise for bone health. Enc MVI 1 po daily. Enc to establish with/ follow up with PCP for primary care concerns, age appropriate screenings and illness.  4. Surveillance for Depo-Provera contraception Continue with Depo 150 mg IM q 11-13 weeks for 1 year.  - medroxyPROGESTERone (DEPO-PROVERA) injection 150 mg  5. Trichomonal vaginitis Treat for Trich with Metronidazole 500 mg #14 1 po  BID for 7 days with food, no EtOH for 24 hr before and until 72 hr after completing medicine. No sex for 14 days and until after partner completes treatment. Enc OTC antifungal cream if has itching during or just after antibiotic use. - metroNIDAZOLE (FLAGYL) 500 MG tablet; Take 1 tablet (500 mg total) by mouth 2 (two) times daily for 7 days.  Dispense: 14 tablet; Refill: 0     No follow-ups on file.  No future appointments.  Jerene Dilling, PA

## 2020-11-18 DIAGNOSIS — M62830 Muscle spasm of back: Secondary | ICD-10-CM | POA: Diagnosis not present

## 2020-11-18 DIAGNOSIS — Z139 Encounter for screening, unspecified: Secondary | ICD-10-CM | POA: Diagnosis not present

## 2020-11-25 DIAGNOSIS — F411 Generalized anxiety disorder: Secondary | ICD-10-CM | POA: Diagnosis not present

## 2020-12-07 ENCOUNTER — Other Ambulatory Visit: Payer: Self-pay

## 2020-12-07 ENCOUNTER — Ambulatory Visit: Payer: 59 | Attending: Family Medicine

## 2020-12-07 DIAGNOSIS — M545 Low back pain, unspecified: Secondary | ICD-10-CM

## 2020-12-07 DIAGNOSIS — R293 Abnormal posture: Secondary | ICD-10-CM | POA: Diagnosis not present

## 2020-12-07 DIAGNOSIS — M533 Sacrococcygeal disorders, not elsewhere classified: Secondary | ICD-10-CM | POA: Diagnosis not present

## 2020-12-07 NOTE — Therapy (Signed)
[facility] [address] [place], [address] Phone: [phone]   Fax:  [phone]  Physical Therapy Evaluation  Patient Details  Name: Tina Young MRN: [id number] Date of Birth: 08/08/1990 Referring Provider (PT): Beverlyn Roux MD   Encounter Date: 12/07/2020   PT End of Session - 12/07/20 1403    Visit Number 1    Number of Visits 17    Date for PT Re-Evaluation 01/04/21    Authorization Type [employer]- [employer]    Authorization Time Period 12/07/20-02/01/21    PT Start Time 1300    PT Stop Time 1350    PT Time Calculation (min) 50 min    Activity Tolerance Patient tolerated treatment well;No increased pain;Patient limited by pain    Behavior During Therapy [facility] for tasks assessed/performed           Past Medical History:  Diagnosis Date  . Anxiety   . GERD (gastroesophageal reflux disease)   . Hypertension     Past Surgical History:  Procedure Laterality Date  . CESAREAN SECTION      There were no vitals filed for this visit.    Subjective Assessment - 12/07/20 1305    Subjective Pt reports back pain while at work, felt a pop, then back locked up.    Pertinent History Pt felt a pop in her back when working on 3/31 with subsequent locking up of her low back. Pt has had pain henceforth pointing to Left SIJ area and Right mid lumbar area. Pt also reports concurrent Rt scapular pain and Left neck pain. In exam pt also has pain in the right lateral and posterolateral iliac fossa. Pt reports history of chornic peristent back pain which includes muscle spasms going back to highschool. Pt has not had any imaging. She denies any frank burning or electrical pain, denies paresthesias or gross motor loss.    How long can you sit comfortably? not limited specifically    How long can you stand comfortably? not limited specifically    How long can you walk comfortably? not limited    Diagnostic tests  No imaging done    Patient Stated Goals return to work    Currently in Pain? Yes    Pain Score 6     Pain Location --   Left SIJ area, Right mid lumbar thoracic             [facility] PT Assessment - 12/07/20 0001      Assessment   Medical Diagnosis Subacute back pain    Referring Provider (PT) Beverlyn Roux MD    Onset Date/Surgical Date 10/15/20    Hand Dominance Right    Prior Therapy none, medical therapies      Precautions   Precautions None    Required Braces or Orthoses --   Pt is electing to wear a back brace, has been wearing at work since she started last year.     Balance Screen   Has the patient fallen in the past 6 months No    Has the patient had a decrease in activity level because of a fear of falling?  No    Is the patient reluctant to leave their home because of a fear of falling?  No      Home Environment   Living Environment Private residence    Living Arrangements Children    Type of Home Apartment    Home Access Level  entry    [place] One level      Prior Function   Level of Independence Independent    Vocation Full time employment;On disability   Not working at the moment, plans on returning   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      Observation/Other Assessments   Focus on Therapeutic Outcomes (FOTO)  46      Transfers   Five time sit to stand comments  18.99 sec hands-free (left SIJ aggravation)      Ambulation/Gait   Gait velocity 10MWT self selected   0.40m/s   Gait Comments excessive midstance bilat, minimal active toe rocker/toe off          Standing posture: mild lumbar hyperlordosis, tenderness to palpation of lumbar perispinal area Supine shows slightly shorter LLE ~ 1cm (also reduced Left hip flexion ROM: when combined with left SIJ area symptoms and (+) pelvic compress test, question Left posterior innominate)   Lumbar Flexion: standing forward bend to floor, full ROM, increased pain, returns to upright standing without hands   Lumbar extension: standing arms across chest, mild hyper lordosis, increased pain in low back Lumbar rotation, seated, measured at shoulders:  -Lt 55 degrees with pain near left SIJ -Rt: 68 degrees with pain near right medial scapula  Self selected gait speed, gait analysis:  -10MWT: 12.09sec, 0.41m/s  -no frank asymetry, no pain exacerbation, mildly advance knee structural valgus for age, smooth heel to mid stance transition, absent midstance to toe-off transition, slight excessive lateral sway without trunk lateral flexion   SLS balance: RLE 12.78sec; Rt lateral iliac fossa (gluteal) pain, pt reports a click;  Lt: 3sec, then 11sec; no symptoms exacerbation   MMT: seated hip flexion 5/5 bilat (ROM WNL); Supine Hip ABDCT: good strength, no signs of frank motor compensation   Supine Hip Flexion ROM:  -Rt WNL, pain at end range, better with slight abduction, worse with adduction (all pain posterior side of hip joint)  -Left side reduced ROM, not as painful  Pelvic compression test: bilat SIJ pain with testing  Dead bug posterior pelvic tilt isometric endurance: 13.7sec then loss of pelvic tilt/lumbar position   Objective measurements completed on examination: See above findings.      PT Education - 12/07/20 1402    Education Details No logical connection between lumbopelvic complaints and Rt scapular pain; need ot resume daily actiivty; finds on Right hip, Left lumbopelvis; HEP    Person(s) Educated Patient    Methods Explanation;Handout    Comprehension Verbalized understanding;Returned demonstration            PT Short Term Goals - 12/07/20 1415      PT SHORT TERM GOAL #1   Title After 4 weeks p tto show FOTO score >55    Baseline 46 at evaluation    Time 4    Period Weeks    Status New    Target Date 01/04/21      PT SHORT TERM GOAL #2   Title After 4 weeks pt to demonstrate SLS balance without exacerbation of pain >30sec bilat    Baseline Eval: ~15sec bialt, pain  on Right lateral and right posterior iliac crest    Time 4    Period Weeks    Status New    Target Date 01/04/21      PT SHORT TERM GOAL #3   Title After 4 weeks pt to demonstrate improved self selected gait speed in 10MWT >1.0    Baseline Eval: 0.64m/s  Time 4    Period Weeks    Status New    Target Date 01/04/21             PT Long Term Goals - 12/07/20 1418      PT LONG TERM GOAL #1   Title After 8 weeks pt to demonstrate pt to show inproved FOTO survey score >65 to demonstrate improved pt perception of facility in ADL performance.    Baseline 46 at eval    Time 8    Period Weeks    Status New    Target Date 02/01/21      PT LONG TERM GOAL #2   Title After 8 weeks pt to demonstrate 5xSTS in <10 sec, 30sec chair rise >15x without exacerbation of power.    Baseline at eval: 5xSTS: 17sec (painful)    Time 8    Period Weeks    Status New    Target Date 02/01/21      PT LONG TERM GOAL #3   Title After 8 weeks pt to demonstrate improved deadbug posterior pelvic tilt isometric endurance >60sec to show improved function of abdominals.    Baseline 13.7sec at eval    Time 8    Period Weeks    Status New    Target Date 02/01/21      PT LONG TERM GOAL #4   Title After 8 weeks pt to demonstrate seated lumbothroacic rotation >65 degrees bilat without pain exacerbation.    Time 8    Period Weeks    Status New    Target Date 02/01/21                  Plan - 12/07/20 1405    Clinical Impression Statement Emillie Chasen is a 43yoF who presents to [facility] after acute on chronic back pain s/p incidious onset at work. Pt has bee out of work April and most of May, no improvement in pain. Examination reveals reduced self-selected gait speed, impaired 5xSTS which provokes pain and is far below the age-matched norm value. Other tests and measures warrant addiitonal assessment of Rt femoroacetabular joint with gluteal spasm, gluteal tendinosis less likely, and Left  sided posterior innominate rotation. Pt has severely limited abdominal strength with deficits in core motor control and stabilization. Exam consistent with mild hypermobility tendencies. Pt will benefit from skilled PT intervetion to help with pain management and self management of deficits in strength and motor control to facilitate return to PLOF and ability to return to work.    Personal Factors and Comorbidities Age;Fitness;Time since onset of injury/illness/exacerbation    Examination-Activity Limitations Bend;Reach Overhead;Carry;Lift;Toileting;Stand;Sit;Sleep;Squat;Stairs;Transfers    Examination-Participation Restrictions Community Activity;Cleaning;Driving;Occupation    Stability/Clinical Decision Making Stable/Uncomplicated    Clinical Decision Making High    Rehab Potential Good    PT Frequency 2x / week    PT Duration 8 weeks    PT Treatment/Interventions ADLs/Self Care Home Management;Cryotherapy;Electrical Stimulation;Moist Heat;Gait training;Functional mobility training;Therapeutic activities;Therapeutic exercise;Balance training;Patient/family education;Passive range of motion;Dry needling;Visual/perceptual remediation/compensation    PT Next Visit Plan Reveisit Left SKTC (screen LLD in supine); commence low level abdominal strengthening, manual therapy to Rt gluteals; palpate lumbar musculature.    PT Home Exercise Plan At eval: Left STKC BID 2x60sec; walking twice daily or more    Consulted and Agree with Plan of Care Patient           Patient will benefit from skilled therapeutic intervention in order to improve the following deficits and impairments:  Decreased activity tolerance,Decreased balance,Decreased knowledge of use of DME,Decreased mobility,Decreased safety awareness,Decreased range of motion,Decreased strength,Difficulty walking,Increased muscle spasms,Hypermobility,Decreased knowledge of precautions,Improper body mechanics,Obesity,Postural dysfunction  Visit  Diagnosis: Sacroiliac pain  Acute bilateral low back pain without sciatica  Abnormal posture     Problem List Patient Active Problem List   Diagnosis Date Noted  . Hypertension 07/31/2019  . Anxiety 07/31/2019  . Hernia of anterior abdominal wall 09/29/2015  . Obesity, unspecified 08/26/2014   2:52 PM, 12/07/20 Etta Grandchild, PT, DPT Physical Therapist - [place] [phone] (Office)    Rayvon Brandvold C 12/07/2020, 2:33 PM  [facility] [address] [place], [address] Phone: [phone]   Fax:  [phone]  Name: CORABELLE SPACKMAN MRN: [id number] Date of Birth: 24-Jun-1991

## 2020-12-10 ENCOUNTER — Other Ambulatory Visit: Payer: Self-pay

## 2020-12-10 ENCOUNTER — Ambulatory Visit: Payer: 59

## 2020-12-10 DIAGNOSIS — M533 Sacrococcygeal disorders, not elsewhere classified: Secondary | ICD-10-CM

## 2020-12-10 DIAGNOSIS — R293 Abnormal posture: Secondary | ICD-10-CM | POA: Diagnosis not present

## 2020-12-10 DIAGNOSIS — M545 Low back pain, unspecified: Secondary | ICD-10-CM

## 2020-12-10 NOTE — Therapy (Signed)
[facility] [address] [place], [address] Phone: [phone]   Fax:  [phone]  Physical Therapy Treatment  Patient Details  Name: Tina Young MRN: [id number] Date of Birth: 11-16-1990 Referring Provider (PT): Beverlyn Roux MD   Encounter Date: 12/10/2020   PT End of Session - 12/10/20 1656    Visit Number 2    Number of Visits 17    Date for PT Re-Evaluation 01/04/21    Authorization Type [employer]- [employer]    Authorization Time Period 12/07/20-02/01/21    PT Start Time 9983    PT Stop Time 1515    PT Time Calculation (min) 42 min    Activity Tolerance Patient tolerated treatment well;No increased pain;Patient limited by pain    Behavior During Therapy [facility] for tasks assessed/performed           Past Medical History:  Diagnosis Date  . Anxiety   . GERD (gastroesophageal reflux disease)   . Hypertension     Past Surgical History:  Procedure Laterality Date  . CESAREAN SECTION      There were no vitals filed for this visit.   Subjective Assessment - 12/10/20 1437    Subjective Pt reports 8/10 pain in R sided low back. some N/T  in R mid back.    Pertinent History Pt felt a pop in her back when working on 3/31 with subsequent locking up of her low back. Pt has had pain henceforth pointing to Left SIJ area and Right mid lumbar area. Pt also reports concurrent Rt scapular pain and Left neck pain. In exam pt also has pain in the right lateral and posterolateral iliac fossa. Pt reports history of chornic peristent back pain which includes muscle spasms going back to highschool. Pt has not had any imaging. She denies any frank burning or electrical pain, denies paresthesias or gross motor loss.    How long can you sit comfortably? not limited specifically    How long can you stand comfortably? not limited specifically    How long can you walk comfortably? not limited    Diagnostic tests No  imaging done    Patient Stated Goals return to work    Currently in Pain? Yes    Pain Score 8     Pain Location Back    Pain Orientation Right    Pain Descriptors / Indicators Tingling    Pain Type Acute pain           There.ex:   Standing posture in comfort is L lateral shift and R pelvis shift. Noted TTP and tightness on r sided lumbothoracic paraspinals.    LLD: 80.5 on LLE. 80.3 on RLE  L sided knee to chest. VC's/TC's to keep knee in neutral position and not abduction: x10 on RLE  LTR's: 1x10/direction. Pain going to L. discontinued.   Innonimate isometrics:    Hip RLE extension/LLE flexion: 8 sec, x5 reps. Alternated to Hip RLE flexion and LLE extension. 8 sec holds, 5 reps.   Reassessment of leg length. 80.5 LLE and 80.4 on RLE.   Seated physioball L lat deviation lumbar flexion and lat flexion paraspinal stretch: x10    Manual Therapy with pt in prone:   10 min of STM to R sided lumbothoracic paraspinals for pain modulation and reduced muscle tension.          Post session:   Pt demonstrates neutral spine alignment in standing and reports  pain is 5-6/10 NPS compared ot 8/10 prior to session with elimination of N/t burning sensation in mid back on R side.      PT Education - 12/10/20 1654    Education Details form/technique with exercise. Educated on gentle lumbar paraspinal stretch. Educated on innominate isometrics with hip flex on LLE and extension on LLE.    Person(s) Educated Patient    Methods Explanation;Demonstration;Tactile cues;Verbal cues    Comprehension Verbalized understanding;Returned demonstration            PT Short Term Goals - 12/07/20 1415      PT SHORT TERM GOAL #1   Title After 4 weeks p tto show FOTO score >55    Baseline 46 at evaluation    Time 4    Period Weeks    Status New    Target Date 01/04/21      PT SHORT TERM GOAL #2   Title After 4 weeks pt to demonstrate SLS balance without exacerbation of pain >30sec bilat     Baseline Eval: ~15sec bialt, pain on Right lateral and right posterior iliac crest    Time 4    Period Weeks    Status New    Target Date 01/04/21      PT SHORT TERM GOAL #3   Title After 4 weeks pt to demonstrate improved self selected gait speed in 10MWT >1.0    Baseline Eval: 0.99m/s    Time 4    Period Weeks    Status New    Target Date 01/04/21             PT Long Term Goals - 12/07/20 1418      PT LONG TERM GOAL #1   Title After 8 weeks pt to demonstrate pt to show inproved FOTO survey score >65 to demonstrate improved pt perception of facility in ADL performance.    Baseline 46 at eval    Time 8    Period Weeks    Status New    Target Date 02/01/21      PT LONG TERM GOAL #2   Title After 8 weeks pt to demonstrate 5xSTS in <10 sec, 30sec chair rise >15x without exacerbation of power.    Baseline at eval: 5xSTS: 17sec (painful)    Time 8    Period Weeks    Status New    Target Date 02/01/21      PT LONG TERM GOAL #3   Title After 8 weeks pt to demonstrate improved deadbug posterior pelvic tilt isometric endurance >60sec to show improved function of abdominals.    Baseline 13.7sec at eval    Time 8    Period Weeks    Status New    Target Date 02/01/21      PT LONG TERM GOAL #4   Title After 8 weeks pt to demonstrate seated lumbothroacic rotation >65 degrees bilat without pain exacerbation.    Time 8    Period Weeks    Status New    Target Date 02/01/21                 Plan - 12/10/20 1656    Clinical Impression Statement Pt came into clinic was 8/10 pain NPS in R low to mid back along paraspinals with L shoulder lat shift and R pelvis shift. Post sesison pt reports 5-6/10 NPS and neutral spine alignment in standing with slight scapulae protraction that pt reports she needs to do to feel better in mid  back. Pt educated on lumbar paraspinal stretch and at home innominate isometric exercise. Pt will continue to benefit from skilled PT services to address  pain and return to PLOF.    Personal Factors and Comorbidities Age;Fitness;Time since onset of injury/illness/exacerbation    Examination-Activity Limitations Bend;Reach Overhead;Carry;Lift;Toileting;Stand;Sit;Sleep;Squat;Stairs;Transfers    Examination-Participation Restrictions Community Activity;Cleaning;Driving;Occupation    Stability/Clinical Decision Making Stable/Uncomplicated    Rehab Potential Good    PT Frequency 2x / week    PT Duration 8 weeks    PT Treatment/Interventions ADLs/Self Care Home Management;Cryotherapy;Electrical Stimulation;Moist Heat;Gait training;Functional mobility training;Therapeutic activities;Therapeutic exercise;Balance training;Patient/family education;Passive range of motion;Dry needling;Visual/perceptual remediation/compensation    PT Next Visit Plan commence low level abdominal strengthening, manual therapy to glutes/paraspinals? Reassess innominate isometrics    PT Home Exercise Plan At eval: Left STKC BID 2x60sec; walking twice daily or more    Consulted and Agree with Plan of Care Patient           Patient will benefit from skilled therapeutic intervention in order to improve the following deficits and impairments:  Decreased activity tolerance,Decreased balance,Decreased knowledge of use of DME,Decreased mobility,Decreased safety awareness,Decreased range of motion,Decreased strength,Difficulty walking,Increased muscle spasms,Hypermobility,Decreased knowledge of precautions,Improper body mechanics,Obesity,Postural dysfunction  Visit Diagnosis: Sacroiliac pain  Acute bilateral low back pain without sciatica  Abnormal posture     Problem List Patient Active Problem List   Diagnosis Date Noted  . Hypertension 07/31/2019  . Anxiety 07/31/2019  . Hernia of anterior abdominal wall 09/29/2015  . Obesity, unspecified 08/26/2014    Salem Caster. Fairly IV, PT, DPT Physical Therapist- [facility]  12/10/2020, 5:04  PM  [facility] [address] [place], [address] Phone: [phone]   Fax:  [phone]  Name: Tina Young MRN: [id number] Date of Birth: 1990-10-13

## 2020-12-15 ENCOUNTER — Encounter: Payer: Self-pay | Admitting: Physical Therapy

## 2020-12-15 ENCOUNTER — Ambulatory Visit: Payer: 59 | Admitting: Physical Therapy

## 2020-12-15 ENCOUNTER — Other Ambulatory Visit: Payer: Self-pay

## 2020-12-15 DIAGNOSIS — M545 Low back pain, unspecified: Secondary | ICD-10-CM

## 2020-12-15 DIAGNOSIS — M533 Sacrococcygeal disorders, not elsewhere classified: Secondary | ICD-10-CM

## 2020-12-15 DIAGNOSIS — R293 Abnormal posture: Secondary | ICD-10-CM

## 2020-12-15 NOTE — Therapy (Signed)
[facility] [address] [place], [address] Phone: [phone]   Fax:  [phone]  Physical Therapy Treatment  Patient Details  Name: Tina Young MRN: [id number] Date of Birth: 1990-09-07 Referring Provider (PT): Beverlyn Roux MD   Encounter Date: 12/15/2020   PT End of Session - 12/15/20 1200    Visit Number 3    Number of Visits 17    Date for PT Re-Evaluation 01/04/21    Authorization Type [employer]- [employer]    Authorization Time Period 12/07/20-02/01/21    PT Start Time 1115    PT Stop Time 1155    PT Time Calculation (min) 40 min    Activity Tolerance Patient tolerated treatment well;No increased pain;Patient limited by pain    Behavior During Therapy [facility] for tasks assessed/performed           Past Medical History:  Diagnosis Date  . Anxiety   . GERD (gastroesophageal reflux disease)   . Hypertension     Past Surgical History:  Procedure Laterality Date  . CESAREAN SECTION      There were no vitals filed for this visit.   Subjective Assessment - 12/15/20 1122    Subjective Pt reports over the weekend she reports she had increased L groin pain this weekend, with continued LBP that she reports feels like it is "in the center" of her back with some R sided bias. Pain is 8/10 in low back today.    Pertinent History Pt felt a pop in her back when working on 3/31 with subsequent locking up of her low back. Pt has had pain henceforth pointing to Left SIJ area and Right mid lumbar area. Pt also reports concurrent Rt scapular pain and Left neck pain. In exam pt also has pain in the right lateral and posterolateral iliac fossa. Pt reports history of chornic peristent back pain which includes muscle spasms going back to highschool. Pt has not had any imaging. She denies any frank burning or electrical pain, denies paresthesias or gross motor loss.    How long can you sit comfortably? not limited  specifically    How long can you stand comfortably? not limited specifically    How long can you walk comfortably? not limited    Diagnostic tests No imaging done    Patient Stated Goals return to work              TherEx:  Nustep seat UE L2 108mins for gentle lumbar rotation and stregthening SPM >50 L SKTC 77min with patient reporting pain at groin Thomas stretch with cuing for posterior pelvic tilt able to carry over with head lift and bilat UE hold at RLE 2x 30sec with PT overpressure Hooklying R hip flex into hands with L hip ext into table 3x 10sec hold Sidelying open book x12 2-3sec hold; bilat with decreased motion and R sided LBP with L rotation; R rotation "feels good"  Hip mobility L hip IR 38d ER 58d painful R hip IR 51d painful ER 60d  Hooklying ball squeeze at knees x12 ; + bridge x12 with cuing for core activation + stabilization with decent carry over  Seated L piriformis stretch 30sec  Assessment of standing posture with L hip ER, L lateral shift and decreased R shoulder height, able to correct 75% with TC needed for full neutral posture, education on maintaining this at work.  PT Education - 12/15/20 1124    Education Details therex form/technique    Person(s) Educated Patient    Methods Explanation;Demonstration;Verbal cues    Comprehension Verbalized understanding;Returned demonstration;Verbal cues required            PT Short Term Goals - 12/07/20 1415      PT SHORT TERM GOAL #1   Title After 4 weeks p tto show FOTO score >55    Baseline 46 at evaluation    Time 4    Period Weeks    Status New    Target Date 01/04/21      PT SHORT TERM GOAL #2   Title After 4 weeks pt to demonstrate SLS balance without exacerbation of pain >30sec bilat    Baseline Eval: ~15sec bialt, pain on Right lateral and right posterior iliac crest    Time 4    Period Weeks    Status New    Target Date 01/04/21      PT  SHORT TERM GOAL #3   Title After 4 weeks pt to demonstrate improved self selected gait speed in 10MWT >1.0    Baseline Eval: 0.74m/s    Time 4    Period Weeks    Status New    Target Date 01/04/21             PT Long Term Goals - 12/07/20 1418      PT LONG TERM GOAL #1   Title After 8 weeks pt to demonstrate pt to show inproved FOTO survey score >65 to demonstrate improved pt perception of facility in ADL performance.    Baseline 46 at eval    Time 8    Period Weeks    Status New    Target Date 02/01/21      PT LONG TERM GOAL #2   Title After 8 weeks pt to demonstrate 5xSTS in <10 sec, 30sec chair rise >15x without exacerbation of power.    Baseline at eval: 5xSTS: 17sec (painful)    Time 8    Period Weeks    Status New    Target Date 02/01/21      PT LONG TERM GOAL #3   Title After 8 weeks pt to demonstrate improved deadbug posterior pelvic tilt isometric endurance >60sec to show improved function of abdominals.    Baseline 13.7sec at eval    Time 8    Period Weeks    Status New    Target Date 02/01/21      PT LONG TERM GOAL #4   Title After 8 weeks pt to demonstrate seated lumbothroacic rotation >65 degrees bilat without pain exacerbation.    Time 8    Period Weeks    Status New    Target Date 02/01/21                 Plan - 12/15/20 1201    Clinical Impression Statement PT continued therex progression for increased lumbopelvic stability with success. Patient with difficulty understanding neutral posture with excessive mobility, but is motivated throughout session to correct this. Pt is able to comply with all cuing for proper technique of therex with no increased pain throughout session. PT will continue progression as able.    Personal Factors and Comorbidities Age;Fitness;Time since onset of injury/illness/exacerbation    Examination-Activity Limitations Bend;Reach Overhead;Carry;Lift;Toileting;Stand;Sit;Sleep;Squat;Stairs;Transfers     Examination-Participation Restrictions Community Activity;Cleaning;Driving;Occupation    Stability/Clinical Decision Making Stable/Uncomplicated    Clinical Decision Making Moderate    Rehab Potential Good  PT Frequency 2x / week    PT Duration 8 weeks    PT Treatment/Interventions ADLs/Self Care Home Management;Cryotherapy;Electrical Stimulation;Moist Heat;Gait training;Functional mobility training;Therapeutic activities;Therapeutic exercise;Balance training;Patient/family education;Passive range of motion;Dry needling;Visual/perceptual remediation/compensation    PT Next Visit Plan commence low level abdominal strengthening, manual therapy to glutes/paraspinals? Reassess innominate isometrics    PT Home Exercise Plan At eval: Left STKC BID 2x60sec; walking twice daily or more    Consulted and Agree with Plan of Care Patient           Patient will benefit from skilled therapeutic intervention in order to improve the following deficits and impairments:  Decreased activity tolerance,Decreased balance,Decreased knowledge of use of DME,Decreased mobility,Decreased safety awareness,Decreased range of motion,Decreased strength,Difficulty walking,Increased muscle spasms,Hypermobility,Decreased knowledge of precautions,Improper body mechanics,Obesity,Postural dysfunction  Visit Diagnosis: Sacroiliac pain  Acute bilateral low back pain without sciatica  Abnormal posture     Problem List Patient Active Problem List   Diagnosis Date Noted  . Hypertension 07/31/2019  . Anxiety 07/31/2019  . Hernia of anterior abdominal wall 09/29/2015  . Obesity, unspecified 08/26/2014   Durwin Reges DPT Durwin Reges 12/15/2020, 1:01 PM  [facility] [address], [place], [address] Phone: [phone]   Fax:  [phone]  Name: Tina Young MRN: [id number] Date of Birth: July 19, 1990

## 2020-12-17 ENCOUNTER — Encounter: Payer: Self-pay | Admitting: Physical Therapy

## 2020-12-17 ENCOUNTER — Ambulatory Visit: Payer: 59 | Attending: Family Medicine | Admitting: Physical Therapy

## 2020-12-17 ENCOUNTER — Other Ambulatory Visit: Payer: Self-pay

## 2020-12-17 ENCOUNTER — Ambulatory Visit: Payer: 59 | Admitting: Physical Therapy

## 2020-12-17 DIAGNOSIS — R293 Abnormal posture: Secondary | ICD-10-CM | POA: Diagnosis not present

## 2020-12-17 DIAGNOSIS — M533 Sacrococcygeal disorders, not elsewhere classified: Secondary | ICD-10-CM | POA: Diagnosis not present

## 2020-12-17 DIAGNOSIS — M545 Low back pain, unspecified: Secondary | ICD-10-CM | POA: Diagnosis not present

## 2020-12-17 NOTE — Therapy (Signed)
[facility] [address] [place], [address] Phone: [phone]   Fax:  [phone]  Physical Therapy Treatment  Patient Details  Name: Tina Young MRN: [id number] Date of Birth: 18-Apr-1991 Referring Provider (PT): Beverlyn Roux MD   Encounter Date: 12/17/2020   PT End of Session - 12/17/20 1443    Visit Number 4    Number of Visits 17    Date for PT Re-Evaluation 01/04/21    Authorization Type [employer]- [employer]    Authorization Time Period 12/07/20-02/01/21    PT Start Time 1430    PT Stop Time 1510    PT Time Calculation (min) 40 min    Activity Tolerance Patient tolerated treatment well;No increased pain;Patient limited by pain    Behavior During Therapy [facility] for tasks assessed/performed           Past Medical History:  Diagnosis Date  . Anxiety   . GERD (gastroesophageal reflux disease)   . Hypertension     Past Surgical History:  Procedure Laterality Date  . CESAREAN SECTION      There were no vitals filed for this visit.   Subjective Assessment - 12/17/20 1437    Subjective Patient reports she hsa been noticing more bilat hip popping, that is not painful. Reports only 7/10 pain today, that she is "not hurting as bad due to taking muscle relaxer". Yesterday had back pain that was severe, not sure what caused this, lasted 51mins, and stretching and putting on back brace helped with this.    Pertinent History Pt felt a pop in her back when working on 3/31 with subsequent locking up of her low back. Pt has had pain henceforth pointing to Left SIJ area and Right mid lumbar area. Pt also reports concurrent Rt scapular pain and Left neck pain. In exam pt also has pain in the right lateral and posterolateral iliac fossa. Pt reports history of chornic peristent back pain which includes muscle spasms going back to highschool. Pt has not had any imaging. She denies any frank burning or electrical  pain, denies paresthesias or gross motor loss.    How long can you sit comfortably? not limited specifically    How long can you stand comfortably? not limited specifically    How long can you walk comfortably? not limited    Diagnostic tests No imaging done    Patient Stated Goals return to work              TherEx:  Nustep seat 6 UE 12 L3 6mins for gentle lumbar rotation and stregthening SPM >40 Hooklying post pelvic tilt x15 with TC needed initially for technique Bridge x10 with cuing for maintained core contraction with decent carry over; from bosu ball (hardside) 2x 8 with cuing for stabilization with good carry over Brinsmade only with increased pain, ceasesed after ~5 reps see manual section  L glute stretch 30sec hold seated  Manual Palpation with localized pain revealed at L superior glute max and mid glute fibers over piriformis. Increased time spent [employer] and trigger point release to this area                        PT Education - 12/17/20 1442    Education Details therex form/technique    Person(s) Educated Patient    Methods Explanation;Demonstration;Verbal cues;Tactile cues    Comprehension Verbalized understanding;Returned demonstration;Verbal cues required;Tactile cues required  PT Short Term Goals - 12/07/20 1415      PT SHORT TERM GOAL #1   Title After 4 weeks p tto show FOTO score >55    Baseline 46 at evaluation    Time 4    Period Weeks    Status New    Target Date 01/04/21      PT SHORT TERM GOAL #2   Title After 4 weeks pt to demonstrate SLS balance without exacerbation of pain >30sec bilat    Baseline Eval: ~15sec bialt, pain on Right lateral and right posterior iliac crest    Time 4    Period Weeks    Status New    Target Date 01/04/21      PT SHORT TERM GOAL #3   Title After 4 weeks pt to demonstrate improved self selected gait speed in 10MWT >1.0    Baseline Eval: 0.44m/s    Time 4    Period Weeks     Status New    Target Date 01/04/21             PT Long Term Goals - 12/07/20 1418      PT LONG TERM GOAL #1   Title After 8 weeks pt to demonstrate pt to show inproved FOTO survey score >65 to demonstrate improved pt perception of facility in ADL performance.    Baseline 46 at eval    Time 8    Period Weeks    Status New    Target Date 02/01/21      PT LONG TERM GOAL #2   Title After 8 weeks pt to demonstrate 5xSTS in <10 sec, 30sec chair rise >15x without exacerbation of power.    Baseline at eval: 5xSTS: 17sec (painful)    Time 8    Period Weeks    Status New    Target Date 02/01/21      PT LONG TERM GOAL #3   Title After 8 weeks pt to demonstrate improved deadbug posterior pelvic tilt isometric endurance >60sec to show improved function of abdominals.    Baseline 13.7sec at eval    Time 8    Period Weeks    Status New    Target Date 02/01/21      PT LONG TERM GOAL #4   Title After 8 weeks pt to demonstrate seated lumbothroacic rotation >65 degrees bilat without pain exacerbation.    Time 8    Period Weeks    Status New    Target Date 02/01/21                 Plan - 12/17/20 1643    Clinical Impression Statement Pt with increased pain with hip and core stabilization therex this session. On examination pain is referred from trigger points at superior glute max, and mid glute max fibers over piriformis. Patient reports some decreased pain following manual techniques, with palpable decrease in tension. Patient continues to demonstrate good ability to self correct posture. Pt is able to comply with all cuing for proper technique of therex with good motivation throughout session.  PT will continue progression as able.    Personal Factors and Comorbidities Age;Fitness;Time since onset of injury/illness/exacerbation    Examination-Activity Limitations Bend;Reach Overhead;Carry;Lift;Toileting;Stand;Sit;Sleep;Squat;Stairs;Transfers    Examination-Participation  Restrictions Community Activity;Cleaning;Driving;Occupation    Stability/Clinical Decision Making Stable/Uncomplicated    Clinical Decision Making Moderate    Rehab Potential Good    PT Frequency 2x / week    PT Duration 8 weeks  PT Treatment/Interventions ADLs/Self Care Home Management;Cryotherapy;Electrical Stimulation;Moist Heat;Gait training;Functional mobility training;Therapeutic activities;Therapeutic exercise;Balance training;Patient/family education;Passive range of motion;Dry needling;Visual/perceptual remediation/compensation    PT Next Visit Plan commence low level abdominal strengthening, manual therapy to glutes/paraspinals? Reassess innominate isometrics    PT Home Exercise Plan At eval: Left STKC BID 2x60sec; walking twice daily or more    Consulted and Agree with Plan of Care Patient           Patient will benefit from skilled therapeutic intervention in order to improve the following deficits and impairments:  Decreased activity tolerance,Decreased balance,Decreased knowledge of use of DME,Decreased mobility,Decreased safety awareness,Decreased range of motion,Decreased strength,Difficulty walking,Increased muscle spasms,Hypermobility,Decreased knowledge of precautions,Improper body mechanics,Obesity,Postural dysfunction  Visit Diagnosis: Sacroiliac pain  Acute bilateral low back pain without sciatica  Abnormal posture     Problem List Patient Active Problem List   Diagnosis Date Noted  . Hypertension 07/31/2019  . Anxiety 07/31/2019  . Hernia of anterior abdominal wall 09/29/2015  . Obesity, unspecified 08/26/2014   Durwin Reges DPT Durwin Reges 12/17/2020, 4:46 PM  [facility] [address] [place], [address] Phone: [phone]   Fax:  [phone]  Name: Tina Young MRN: [id number] Date of Birth: 07-31-1990

## 2020-12-22 ENCOUNTER — Encounter: Payer: 59 | Admitting: Physical Therapy

## 2020-12-22 DIAGNOSIS — M5441 Lumbago with sciatica, right side: Secondary | ICD-10-CM | POA: Diagnosis not present

## 2020-12-22 DIAGNOSIS — M546 Pain in thoracic spine: Secondary | ICD-10-CM | POA: Diagnosis not present

## 2020-12-24 ENCOUNTER — Ambulatory Visit: Payer: 59 | Admitting: Physical Therapy

## 2020-12-24 ENCOUNTER — Encounter: Payer: Self-pay | Admitting: Physical Therapy

## 2020-12-24 ENCOUNTER — Other Ambulatory Visit: Payer: Self-pay

## 2020-12-24 ENCOUNTER — Other Ambulatory Visit: Payer: Self-pay | Admitting: Family Medicine

## 2020-12-24 DIAGNOSIS — R293 Abnormal posture: Secondary | ICD-10-CM | POA: Diagnosis not present

## 2020-12-24 DIAGNOSIS — M62830 Muscle spasm of back: Secondary | ICD-10-CM

## 2020-12-24 DIAGNOSIS — M5441 Lumbago with sciatica, right side: Secondary | ICD-10-CM

## 2020-12-24 DIAGNOSIS — M545 Low back pain, unspecified: Secondary | ICD-10-CM

## 2020-12-24 DIAGNOSIS — M533 Sacrococcygeal disorders, not elsewhere classified: Secondary | ICD-10-CM

## 2020-12-24 DIAGNOSIS — G8929 Other chronic pain: Secondary | ICD-10-CM

## 2020-12-24 DIAGNOSIS — M546 Pain in thoracic spine: Secondary | ICD-10-CM

## 2020-12-24 NOTE — Therapy (Signed)
[place] [address], [place], [address] Phone: [phone]   Fax:  [phone]  Physical Therapy Treatment  Patient Details  Name: Tina Young MRN: [id number] Date of Birth: 1991-02-16 Referring Provider (PT): Beverlyn Roux MD   Encounter Date: 12/24/2020   PT End of Session - 12/24/20 1125     Visit Number 5    Number of Visits 17    Date for PT Re-Evaluation 01/04/21    Authorization Type [employer]- [employer]    Authorization Time Period 12/07/20-02/01/21    PT Start Time 1115    PT Stop Time 1155    PT Time Calculation (min) 40 min    Activity Tolerance Patient tolerated treatment well;No increased pain;Patient limited by pain    Behavior During Therapy [facility] for tasks assessed/performed             Past Medical History:  Diagnosis Date   Anxiety    GERD (gastroesophageal reflux disease)    Hypertension     Past Surgical History:  Procedure Laterality Date   CESAREAN SECTION      There were no vitals filed for this visit.   Subjective Assessment - 12/24/20 1118     Subjective Reports her pain has been worse over the past couple days. She saw her MD Tuesday and she ordered a MRI. She reprots her pain is 9/10 and it is in bilat glutes, and that her SIJ feels stiff, and her L side "popped" the other day which really hurt. She reports temporary relief from her glute stretches    Pertinent History Pt felt a pop in her back when working on 3/31 with subsequent locking up of her low back. Pt has had pain henceforth pointing to Left SIJ area and Right mid lumbar area. Pt also reports concurrent Rt scapular pain and Left neck pain. In exam pt also has pain in the right lateral and posterolateral iliac fossa. Pt reports history of chornic peristent back pain which includes muscle spasms going back to highschool. Pt has not had any imaging. She denies any frank burning or electrical pain, denies  paresthesias or gross motor loss.    How long can you sit comfortably? not limited specifically    How long can you stand comfortably? not limited specifically    How long can you walk comfortably? not limited    Diagnostic tests No imaging done    Patient Stated Goals return to work             Ther-Ex Nustep seat 6 UE 12 L3 65mins for gentle lumbar rotation and stregthening SPM >40 Hooklying figure 4 and glute stretch x30sec hold each stretch each side Posterior pelvic tilt in hooklying x12 with good carry over of cuing for core activation  Posterior pelvic tilt against wall x12 with good carry over of hooklying position      ESTIM + heat pack HiVolt ESTIM 19min at patient tolerated 205V increased to 245V R; 205V to 210V L through treatment at lumbar paraspinal area . Attempted to decrease muscle tension and relieve pain. With PT assessing patient tolerance throughout (increasing intensity as needed), monitoring skin integrity (normal), with decreased pain noted from patient  Pain following: 6/10  Manual: patient unable to tolerate manual techniques, decreased tension to bilat glutes this session. Trigger points with concordant pain sign to bilat lumbar paraspinal/QL  PT Education - 12/24/20 1124     Education Details therex form/technique    Person(s) Educated Patient    Methods Explanation;Demonstration;Verbal cues    Comprehension Verbalized understanding;Verbal cues required;Returned demonstration              PT Short Term Goals - 12/07/20 1415       PT SHORT TERM GOAL #1   Title After 4 weeks p tto show FOTO score >55    Baseline 46 at evaluation    Time 4    Period Weeks    Status New    Target Date 01/04/21      PT SHORT TERM GOAL #2   Title After 4 weeks pt to demonstrate SLS balance without exacerbation of pain >30sec bilat    Baseline Eval: ~15sec bialt, pain on Right lateral and right posterior iliac  crest    Time 4    Period Weeks    Status New    Target Date 01/04/21      PT SHORT TERM GOAL #3   Title After 4 weeks pt to demonstrate improved self selected gait speed in 10MWT >1.0    Baseline Eval: 0.67m/s    Time 4    Period Weeks    Status New    Target Date 01/04/21               PT Long Term Goals - 12/07/20 1418       PT LONG TERM GOAL #1   Title After 8 weeks pt to demonstrate pt to show inproved FOTO survey score >65 to demonstrate improved pt perception of facility in ADL performance.    Baseline 46 at eval    Time 8    Period Weeks    Status New    Target Date 02/01/21      PT LONG TERM GOAL #2   Title After 8 weeks pt to demonstrate 5xSTS in <10 sec, 30sec chair rise >15x without exacerbation of power.    Baseline at eval: 5xSTS: 17sec (painful)    Time 8    Period Weeks    Status New    Target Date 02/01/21      PT LONG TERM GOAL #3   Title After 8 weeks pt to demonstrate improved deadbug posterior pelvic tilt isometric endurance >60sec to show improved function of abdominals.    Baseline 13.7sec at eval    Time 8    Period Weeks    Status New    Target Date 02/01/21      PT LONG TERM GOAL #4   Title After 8 weeks pt to demonstrate seated lumbothroacic rotation >65 degrees bilat without pain exacerbation.    Time 8    Period Weeks    Status New    Target Date 02/01/21                   Plan - 12/24/20 1238     Clinical Impression Statement PT with increased pain this session that she reports she has been having for the past 4 days. Pain is localized to bilat lumbar paraspinal group/QL, R sided > L. PT utilized modalities and therex to reduce pain from 9/10 to 6/10. PT continued education on core activation and postural neutral with patient able to correct with minimal cuing. PT will continue progression as able.    Personal Factors and Comorbidities Age;Fitness;Time since onset of injury/illness/exacerbation    Examination-Activity  Limitations Bend;Reach Overhead;Carry;Lift;Toileting;Stand;Sit;Sleep;Squat;Stairs;Transfers    Examination-Participation  Restrictions Community Activity;Cleaning;Driving;Occupation    Stability/Clinical Decision Making Stable/Uncomplicated    Clinical Decision Making Moderate    Rehab Potential Good    PT Frequency 2x / week    PT Duration 8 weeks    PT Treatment/Interventions ADLs/Self Care Home Management;Cryotherapy;Electrical Stimulation;Moist Heat;Gait training;Functional mobility training;Therapeutic activities;Therapeutic exercise;Balance training;Patient/family education;Passive range of motion;Dry needling;Visual/perceptual remediation/compensation    PT Next Visit Plan commence low level abdominal strengthening, manual therapy to glutes/paraspinals? Reassess innominate isometrics    PT Home Exercise Plan At eval: Left STKC BID 2x60sec; walking twice daily or more    Consulted and Agree with Plan of Care Patient             Patient will benefit from skilled therapeutic intervention in order to improve the following deficits and impairments:  Decreased activity tolerance, Decreased balance, Decreased knowledge of use of DME, Decreased mobility, Decreased safety awareness, Decreased range of motion, Decreased strength, Difficulty walking, Increased muscle spasms, Hypermobility, Decreased knowledge of precautions, Improper body mechanics, Obesity, Postural dysfunction  Visit Diagnosis: Sacroiliac pain  Acute bilateral low back pain without sciatica  Abnormal posture     Problem List Patient Active Problem List   Diagnosis Date Noted   Hypertension 07/31/2019   Anxiety 07/31/2019   Hernia of anterior abdominal wall 09/29/2015   Obesity, unspecified 08/26/2014   Durwin Reges DPT Durwin Reges 12/24/2020, 12:50 PM  [place] [address] [place], [address] Phone: [phone]   Fax:   [phone]  Name: Tina Young MRN: [id number] Date of Birth: 01-07-1991

## 2020-12-29 ENCOUNTER — Ambulatory Visit: Payer: 59 | Admitting: Physical Therapy

## 2020-12-29 ENCOUNTER — Other Ambulatory Visit: Payer: Self-pay

## 2020-12-29 ENCOUNTER — Encounter: Payer: 59 | Admitting: Physical Therapy

## 2020-12-29 ENCOUNTER — Encounter: Payer: Self-pay | Admitting: Physical Therapy

## 2020-12-29 DIAGNOSIS — M545 Low back pain, unspecified: Secondary | ICD-10-CM

## 2020-12-29 DIAGNOSIS — M533 Sacrococcygeal disorders, not elsewhere classified: Secondary | ICD-10-CM

## 2020-12-29 DIAGNOSIS — R293 Abnormal posture: Secondary | ICD-10-CM | POA: Diagnosis not present

## 2020-12-29 NOTE — Therapy (Signed)
[facility] [address], [place], [address] Phone: [phone]   Fax:  [phone]  Physical Therapy Treatment  Patient Details  Name: Tina Young MRN: [id number] Date of Birth: [birth year] Referring Provider (PT): Beverlyn Roux MD   Encounter Date: 12/29/2020   PT End of Session - 12/29/20 0912     Visit Number 6    Number of Visits 17    Date for PT Re-Evaluation 01/04/21    Authorization Type [employer]- [employer]    Authorization Time Period 12/07/20-02/01/21    PT Start Time 0900    PT Stop Time 0939    PT Time Calculation (min) 39 min    Activity Tolerance Patient tolerated treatment well;No increased pain    Behavior During Therapy WFL for tasks assessed/performed             Past Medical History:  Diagnosis Date   Anxiety    GERD (gastroesophageal reflux disease)    Hypertension     Past Surgical History:  Procedure Laterality Date   CESAREAN SECTION      There were no vitals filed for this visit.   Subjective Assessment - 12/29/20 0904     Subjective Reports decreased pain from last session. Had good pain relief from ESTM last session. Her pain today is 5/10, but is reporting her pain is more when she stands >10-56mins to complete cooking, dishes, with some burning into her upper back; and she feels her back pain when she lifts over 10#.    Pertinent History Pt felt a pop in her back when working on 3/31 with subsequent locking up of her low back. Pt has had pain henceforth pointing to Left SIJ area and Right mid lumbar area. Pt also reports concurrent Rt scapular pain and Left neck pain. In exam pt also has pain in the right lateral and posterolateral iliac fossa. Pt reports history of chornic peristent back pain which includes muscle spasms going back to highschool. Pt has not had any imaging. She denies any frank burning or electrical pain, denies paresthesias or gross motor loss.     How long can you sit comfortably? not limited specifically    How long can you stand comfortably? not limited specifically    How long can you walk comfortably? not limited    Diagnostic tests No imaging done    Patient Stated Goals return to work            Ther-Ex Nustep seat 6 UE 12 L3 48mins for gentle lumbar rotation and stregthening SPM >40 Hooklying R hip flex into hands with L hip ext into table 3x 10sec hold Bridge from bosu ball hard side 2x 12 with max cuing for technique without pelvic shift with good carry over Alt hip flex (marching) sitting on tball 2x 12 with TC for initial positioning, difficulty with stabilizing ball, better carry over in second set  Active rest between sets in PPT Seated on theraball rows blutb with max cuing for initial technique, good carry over Lifting 20# box with demo/VC/TC needed for proper technique, pt initially with box far from self utilizing lumbar extensors to lift, ultimately able to complete with row + squat technique with patient reporting "feeling it" in her glutes as opposed to LB. PT encouraged patient in utilization of this technique at home with understanding x6 reps to success of last 2  PT Education - 12/29/20 0911     Education Details therex form/technique    Person(s) Educated Patient    Methods Explanation;Demonstration;Verbal cues    Comprehension Verbalized understanding;Verbal cues required;Returned demonstration              PT Short Term Goals - 12/07/20 1415       PT SHORT TERM GOAL #1   Title After 4 weeks p tto show FOTO score >55    Baseline 46 at evaluation    Time 4    Period Weeks    Status New    Target Date 01/04/21      PT SHORT TERM GOAL #2   Title After 4 weeks pt to demonstrate SLS balance without exacerbation of pain >30sec bilat    Baseline Eval: ~15sec bialt, pain on Right lateral and right posterior iliac crest    Time 4    Period  Weeks    Status New    Target Date 01/04/21      PT SHORT TERM GOAL #3   Title After 4 weeks pt to demonstrate improved self selected gait speed in 10MWT >1.0    Baseline Eval: 0.37m/s    Time 4    Period Weeks    Status New    Target Date 01/04/21               PT Long Term Goals - 12/07/20 1418       PT LONG TERM GOAL #1   Title After 8 weeks pt to demonstrate pt to show inproved FOTO survey score >65 to demonstrate improved pt perception of facility in ADL performance.    Baseline 46 at eval    Time 8    Period Weeks    Status New    Target Date 02/01/21      PT LONG TERM GOAL #2   Title After 8 weeks pt to demonstrate 5xSTS in <10 sec, 30sec chair rise >15x without exacerbation of power.    Baseline at eval: 5xSTS: 17sec (painful)    Time 8    Period Weeks    Status New    Target Date 02/01/21      PT LONG TERM GOAL #3   Title After 8 weeks pt to demonstrate improved deadbug posterior pelvic tilt isometric endurance >60sec to show improved function of abdominals.    Baseline 13.7sec at eval    Time 8    Period Weeks    Status New    Target Date 02/01/21      PT LONG TERM GOAL #4   Title After 8 weeks pt to demonstrate seated lumbothroacic rotation >65 degrees bilat without pain exacerbation.    Time 8    Period Weeks    Status New    Target Date 02/01/21                   Plan - 12/29/20 0920     Clinical Impression Statement Pt with decreased pain this session allowing for increased therex progression for increased core and hip activation with carry over into functional lifting. Paitent is able to comply with multimodal cuing, and ultimately demonstrate safe lifting mechanics of 20# following progression and cuing. Patient with definite need of supervision and cuing in orer to demonstrate safe lifting of box, but is motivated to complete correctly. Patient without increased pain throughout session which is an improvement. Pt is to have MRI this  weekend. PT will continue progression as able.  Personal Factors and Comorbidities Age;Fitness;Time since onset of injury/illness/exacerbation    Examination-Activity Limitations Bend;Reach Overhead;Carry;Lift;Toileting;Stand;Sit;Sleep;Squat;Stairs;Transfers    Examination-Participation Restrictions Community Activity;Cleaning;Driving;Occupation    Stability/Clinical Decision Making Stable/Uncomplicated    Clinical Decision Making Moderate    Rehab Potential Good    PT Frequency 2x / week    PT Duration 8 weeks    PT Treatment/Interventions ADLs/Self Care Home Management;Cryotherapy;Electrical Stimulation;Moist Heat;Gait training;Functional mobility training;Therapeutic activities;Therapeutic exercise;Balance training;Patient/family education;Passive range of motion;Dry needling;Visual/perceptual remediation/compensation    PT Next Visit Plan commence low level abdominal strengthening, manual therapy to glutes/paraspinals? Reassess innominate isometrics    PT Home Exercise Plan At eval: Left STKC BID 2x60sec; walking twice daily or more    Consulted and Agree with Plan of Care Patient             Patient will benefit from skilled therapeutic intervention in order to improve the following deficits and impairments:  Decreased activity tolerance, Decreased balance, Decreased knowledge of use of DME, Decreased mobility, Decreased safety awareness, Decreased range of motion, Decreased strength, Difficulty walking, Increased muscle spasms, Hypermobility, Decreased knowledge of precautions, Improper body mechanics, Obesity, Postural dysfunction  Visit Diagnosis: Sacroiliac pain  Acute bilateral low back pain without sciatica  Abnormal posture     Problem List Patient Active Problem List   Diagnosis Date Noted   Hypertension 07/31/2019   Anxiety 07/31/2019   Hernia of anterior abdominal wall 09/29/2015   Obesity, unspecified 08/26/2014   Durwin Reges DPT Durwin Reges 12/29/2020, 9:46 AM  [facility] [address] [place], [address] Phone: [phone]   Fax:  [phone]  Name: Tina Young MRN: [id number] Date of Birth: 1991-01-31

## 2020-12-31 ENCOUNTER — Other Ambulatory Visit: Payer: Self-pay

## 2020-12-31 ENCOUNTER — Encounter: Payer: 59 | Admitting: Physical Therapy

## 2020-12-31 ENCOUNTER — Encounter: Payer: Self-pay | Admitting: Physical Therapy

## 2020-12-31 ENCOUNTER — Ambulatory Visit: Payer: 59 | Admitting: Physical Therapy

## 2020-12-31 DIAGNOSIS — M533 Sacrococcygeal disorders, not elsewhere classified: Secondary | ICD-10-CM | POA: Diagnosis not present

## 2020-12-31 DIAGNOSIS — M545 Low back pain, unspecified: Secondary | ICD-10-CM | POA: Diagnosis not present

## 2020-12-31 DIAGNOSIS — R293 Abnormal posture: Secondary | ICD-10-CM | POA: Diagnosis not present

## 2020-12-31 NOTE — Therapy (Signed)
[place] [facility] [address] [place], [address] Phone: [phone]   Fax:  [phone]  Physical Therapy Treatment  Patient Details  Name: Tina Young MRN: [id number] Date of Birth: [birth year] Referring Provider (PT): Beverlyn Roux MD   Encounter Date: 12/31/2020   PT End of Session - 12/31/20 0902     Visit Number 7    Number of Visits 17    Date for PT Re-Evaluation 01/04/21    Authorization Type [employer]- [employer]    Authorization Time Period 12/07/20-02/01/21    Authorization - Visit Number 7    Authorization - Number of Visits 10    PT Start Time 0900    PT Stop Time 0938    PT Time Calculation (min) 38 min    Activity Tolerance Patient tolerated treatment well;No increased pain    Behavior During Therapy WFL for tasks assessed/performed             Past Medical History:  Diagnosis Date   Anxiety    GERD (gastroesophageal reflux disease)    Hypertension     Past Surgical History:  Procedure Laterality Date   CESAREAN SECTION      There were no vitals filed for this visit.   Subjective Assessment - 12/31/20 0900     Subjective Increased pain today following not getting good sleep and having to babysit her neice and nephew last night unexpectedly. Reports her back is very painful on R side, and she is having spasms there. She has been working on her standing posture, but she feels crooked when she does this. Is getting MRI on sunday.    Pertinent History Pt felt a pop in her back when working on 3/31 with subsequent locking up of her low back. Pt has had pain henceforth pointing to Left SIJ area and Right mid lumbar area. Pt also reports concurrent Rt scapular pain and Left neck pain. In exam pt also has pain in the right lateral and posterolateral iliac fossa. Pt reports history of chornic peristent back pain which includes muscle spasms going back to highschool. Pt has not had any  imaging. She denies any frank burning or electrical pain, denies paresthesias or gross motor loss.    How long can you sit comfortably? not limited specifically    How long can you stand comfortably? not limited specifically    How long can you walk comfortably? not limited    Diagnostic tests No imaging done    Patient Stated Goals return to work             Ther-Ex Nustep seat 6 UE 12 L3 4mins for gentle lumbar rotation and stregthening SPM >40 Hooklying lower trunk rotation x20 with min cuing for technique SKTC hookyling 30sec; SKTC CLLE ext 30sec Theraball roll outs x12 with cuing for breath control L lateral childs pose 30sec hold   Manual STM with trigger point release to R QL, and lumbar paraspinals Following: Dry Needling: (1/1) 24mm .25 needles placed along the R lumbar paraspinals (shelving technique) and multifidi (inf/med 2 finger breadths from spinous process) to decrease increased muscular spasms and trigger points with the patient positioned in supine. Patient was educated on risks and benefits of therapy and verbally consents to PT.     ESTIM + heat pack HiVolt ESTIM 10 min at patient tolerated 225V increased to 260V through treatment at R lumbar paraspinals/QL area . Attempted d/t success of treatment at  previous session success. With PT assessing patient tolerance throughout (increasing intensity as needed), monitoring skin integrity (normal), with decreased pain noted from patient                          PT Education - 12/31/20 0902     Education Details therex form/technique    Person(s) Educated Patient    Methods Explanation;Demonstration;Verbal cues    Comprehension Verbalized understanding;Returned demonstration;Verbal cues required              PT Short Term Goals - 12/07/20 1415       PT SHORT TERM GOAL #1   Title After 4 weeks p tto show FOTO score >55    Baseline 46 at evaluation    Time 4    Period Weeks    Status  New    Target Date 01/04/21      PT SHORT TERM GOAL #2   Title After 4 weeks pt to demonstrate SLS balance without exacerbation of pain >30sec bilat    Baseline Eval: ~15sec bialt, pain on Right lateral and right posterior iliac crest    Time 4    Period Weeks    Status New    Target Date 01/04/21      PT SHORT TERM GOAL #3   Title After 4 weeks pt to demonstrate improved self selected gait speed in 10MWT >1.0    Baseline Eval: 0.75m/s    Time 4    Period Weeks    Status New    Target Date 01/04/21               PT Long Term Goals - 12/07/20 1418       PT LONG TERM GOAL #1   Title After 8 weeks pt to demonstrate pt to show inproved FOTO survey score >65 to demonstrate improved pt perception of facility in ADL performance.    Baseline 46 at eval    Time 8    Period Weeks    Status New    Target Date 02/01/21      PT LONG TERM GOAL #2   Title After 8 weeks pt to demonstrate 5xSTS in <10 sec, 30sec chair rise >15x without exacerbation of power.    Baseline at eval: 5xSTS: 17sec (painful)    Time 8    Period Weeks    Status New    Target Date 02/01/21      PT LONG TERM GOAL #3   Title After 8 weeks pt to demonstrate improved deadbug posterior pelvic tilt isometric endurance >60sec to show improved function of abdominals.    Baseline 13.7sec at eval    Time 8    Period Weeks    Status New    Target Date 02/01/21      PT LONG TERM GOAL #4   Title After 8 weeks pt to demonstrate seated lumbothroacic rotation >65 degrees bilat without pain exacerbation.    Time 8    Period Weeks    Status New    Target Date 02/01/21                   Plan - 12/31/20 0943     Clinical Impression Statement PT session focused on decreasing tension of R sided lumbar paraspinals/QL and pain reduction with success. Patient reports 4/10 pain following session which is an improvement. Patient with reduced palpable tension as well following TDN, and ESTIM. Paitent is able to  comply with all cuing for proper technique of therex with sucess. Pt to obtain MRI this weekend, PT will continue progression as able following results.    Personal Factors and Comorbidities Age;Fitness;Time since onset of injury/illness/exacerbation    Examination-Activity Limitations Bend;Reach Overhead;Carry;Lift;Toileting;Stand;Sit;Sleep;Squat;Stairs;Transfers    Examination-Participation Restrictions Community Activity;Cleaning;Driving;Occupation    Stability/Clinical Decision Making Stable/Uncomplicated    Clinical Decision Making Moderate    Rehab Potential Good    PT Duration 8 weeks    PT Treatment/Interventions ADLs/Self Care Home Management;Cryotherapy;Electrical Stimulation;Moist Heat;Gait training;Functional mobility training;Therapeutic activities;Therapeutic exercise;Balance training;Patient/family education;Passive range of motion;Dry needling;Visual/perceptual remediation/compensation    PT Next Visit Plan commence low level abdominal strengthening, manual therapy to glutes/paraspinals? Reassess innominate isometrics    PT Home Exercise Plan At eval: Left STKC BID 2x60sec; walking twice daily or more    Consulted and Agree with Plan of Care Patient             Patient will benefit from skilled therapeutic intervention in order to improve the following deficits and impairments:  Decreased activity tolerance, Decreased balance, Decreased knowledge of use of DME, Decreased mobility, Decreased safety awareness, Decreased range of motion, Decreased strength, Difficulty walking, Increased muscle spasms, Hypermobility, Decreased knowledge of precautions, Improper body mechanics, Obesity, Postural dysfunction  Visit Diagnosis: Sacroiliac pain  Acute bilateral low back pain without sciatica  Abnormal posture     Problem List Patient Active Problem List   Diagnosis Date Noted   Hypertension 07/31/2019   Anxiety 07/31/2019   Hernia of anterior abdominal wall 09/29/2015    Obesity, unspecified 08/26/2014   Durwin Reges DPT Durwin Reges 12/31/2020, 10:04 AM  [facility] [address] [place], [address] Phone: [phone]   Fax:  [phone]  Name: Tina Young MRN: [id number] Date of Birth: March 13, 1991

## 2021-01-03 ENCOUNTER — Ambulatory Visit
Admission: RE | Admit: 2021-01-03 | Discharge: 2021-01-03 | Disposition: A | Discharge status: Home / Self Care | Payer: 59 | Source: Ambulatory Visit | Attending: Family Medicine | Admitting: Family Medicine

## 2021-01-03 ENCOUNTER — Other Ambulatory Visit: Payer: Self-pay

## 2021-01-03 DIAGNOSIS — M546 Pain in thoracic spine: Secondary | ICD-10-CM

## 2021-01-03 DIAGNOSIS — G8929 Other chronic pain: Secondary | ICD-10-CM | POA: Insufficient documentation

## 2021-01-03 DIAGNOSIS — M62830 Muscle spasm of back: Secondary | ICD-10-CM | POA: Insufficient documentation

## 2021-01-03 DIAGNOSIS — M5441 Lumbago with sciatica, right side: Secondary | ICD-10-CM | POA: Diagnosis not present

## 2021-01-03 DIAGNOSIS — M5127 Other intervertebral disc displacement, lumbosacral region: Secondary | ICD-10-CM | POA: Diagnosis not present

## 2021-01-03 DIAGNOSIS — M47816 Spondylosis without myelopathy or radiculopathy, lumbar region: Secondary | ICD-10-CM | POA: Diagnosis not present

## 2021-01-03 DIAGNOSIS — M47814 Spondylosis without myelopathy or radiculopathy, thoracic region: Secondary | ICD-10-CM | POA: Diagnosis not present

## 2021-01-03 DIAGNOSIS — M5126 Other intervertebral disc displacement, lumbar region: Secondary | ICD-10-CM | POA: Diagnosis not present

## 2021-01-05 ENCOUNTER — Encounter: Payer: Self-pay | Admitting: Physical Therapy

## 2021-01-05 ENCOUNTER — Other Ambulatory Visit: Payer: Self-pay

## 2021-01-05 ENCOUNTER — Ambulatory Visit: Payer: 59 | Admitting: Physical Therapy

## 2021-01-05 DIAGNOSIS — M533 Sacrococcygeal disorders, not elsewhere classified: Secondary | ICD-10-CM

## 2021-01-05 DIAGNOSIS — R293 Abnormal posture: Secondary | ICD-10-CM | POA: Diagnosis not present

## 2021-01-05 DIAGNOSIS — M545 Low back pain, unspecified: Secondary | ICD-10-CM | POA: Diagnosis not present

## 2021-01-05 NOTE — Therapy (Signed)
[facility] [address], [place], [address] Phone: [phone]   Fax:  [phone]  Physical Therapy Treatment  Patient Details  Name: Tina Young MRN: [id number] Date of Birth: 04/03/1991 Referring Provider (PT): Beverlyn Roux MD   Encounter Date: 01/05/2021   PT End of Session - 01/05/21 1404     Visit Number 8    Number of Visits 17    Date for PT Re-Evaluation 01/04/21    Authorization Type [employer]- [employer]    Authorization Time Period 12/07/20-02/01/21    Authorization - Visit Number 8    Authorization - Number of Visits 10    PT Start Time 1696    PT Stop Time 7893    PT Time Calculation (min) 40 min    Activity Tolerance Patient tolerated treatment well;No increased pain    Behavior During Therapy WFL for tasks assessed/performed             Past Medical History:  Diagnosis Date   Anxiety    GERD (gastroesophageal reflux disease)    Hypertension     Past Surgical History:  Procedure Laterality Date   CESAREAN SECTION      There were no vitals filed for this visit.   Subjective Assessment - 01/05/21 1350     Subjective Patient reports she had her MRI this weekend and saw her results in mychart. She reports the report said she needs a pelvic MRI. Reports her pain today is at the R side of her tailbone and up her back 8/10. Completing HEP. She felt better following TDN and would like to complete this again today.    Pertinent History Pt felt a pop in her back when working on 3/31 with subsequent locking up of her low back. Pt has had pain henceforth pointing to Left SIJ area and Right mid lumbar area. Pt also reports concurrent Rt scapular pain and Left neck pain. In exam pt also has pain in the right lateral and posterolateral iliac fossa. Pt reports history of chornic peristent back pain which includes muscle spasms going back to highschool. Pt has not had any imaging. She denies  any frank burning or electrical pain, denies paresthesias or gross motor loss.    How long can you sit comfortably? not limited specifically    How long can you stand comfortably? not limited specifically    How long can you walk comfortably? not limited    Diagnostic tests No imaging done    Patient Stated Goals return to work              Ther-Ex Nustep seat 6 UE 12 L3 22mins for gentle lumbar rotation and stregthening SPM >40 Hooklying lower trunk rotation x20 with min cuing for technique Bridge with ball squeeze 2x 12 with cuing to maintain ball squeeze with good carry over      Manual STM with trigger point release to R QL, and lumbar paraspinals Following: Dry Needling: (1/1) 45mm .25 needles placed along the R lumbar paraspinals (shelving technique) and multifidi (inf/med 2 finger breadths from spinous process) to decrease increased muscular spasms and trigger points with the patient positioned in supine. Patient was educated on risks and benefits of therapy and verbally consents to PT.     ESTIM + heat pack HiVolt ESTIM 10 min at patient tolerated 205V increased to 240V through treatment at R lumbar paraspinals/QL area . Attempted d/t success of treatment at previous  session success. With PT assessing patient tolerance throughout (increasing intensity as needed), monitoring skin integrity (normal), with decreased pain noted from patient                          PT Education - 01/05/21 1404     Education Details therex form/technique    Person(s) Educated Patient    Methods Explanation;Demonstration;Verbal cues    Comprehension Verbalized understanding;Returned demonstration;Verbal cues required              PT Short Term Goals - 12/07/20 1415       PT SHORT TERM GOAL #1   Title After 4 weeks p tto show FOTO score >55    Baseline 46 at evaluation    Time 4    Period Weeks    Status New    Target Date 01/04/21      PT SHORT TERM GOAL #2    Title After 4 weeks pt to demonstrate SLS balance without exacerbation of pain >30sec bilat    Baseline Eval: ~15sec bialt, pain on Right lateral and right posterior iliac crest    Time 4    Period Weeks    Status New    Target Date 01/04/21      PT SHORT TERM GOAL #3   Title After 4 weeks pt to demonstrate improved self selected gait speed in 10MWT >1.0    Baseline Eval: 0.71m/s    Time 4    Period Weeks    Status New    Target Date 01/04/21               PT Long Term Goals - 12/07/20 1418       PT LONG TERM GOAL #1   Title After 8 weeks pt to demonstrate pt to show inproved FOTO survey score >65 to demonstrate improved pt perception of facility in ADL performance.    Baseline 46 at eval    Time 8    Period Weeks    Status New    Target Date 02/01/21      PT LONG TERM GOAL #2   Title After 8 weeks pt to demonstrate 5xSTS in <10 sec, 30sec chair rise >15x without exacerbation of power.    Baseline at eval: 5xSTS: 17sec (painful)    Time 8    Period Weeks    Status New    Target Date 02/01/21      PT LONG TERM GOAL #3   Title After 8 weeks pt to demonstrate improved deadbug posterior pelvic tilt isometric endurance >60sec to show improved function of abdominals.    Baseline 13.7sec at eval    Time 8    Period Weeks    Status New    Target Date 02/01/21      PT LONG TERM GOAL #4   Title After 8 weeks pt to demonstrate seated lumbothroacic rotation >65 degrees bilat without pain exacerbation.    Time 8    Period Weeks    Status New    Target Date 02/01/21                   Plan - 01/05/21 1412     Clinical Impression Statement PT session with continued focu on tension reduction and pain reduction with success. Patient reports decreased pain to 6/10 following. PT reviewed MRI results with need for further pelvic imaging prior to agressive therex progression. Pt able to complete availible therex with  good carry over of all multimodal cuing. PT wil  lcontinue progression as able.    Personal Factors and Comorbidities Age;Fitness;Time since onset of injury/illness/exacerbation    Examination-Activity Limitations Bend;Reach Overhead;Carry;Lift;Toileting;Stand;Sit;Sleep;Squat;Stairs;Transfers    Examination-Participation Restrictions Community Activity;Cleaning;Driving;Occupation    Stability/Clinical Decision Making Stable/Uncomplicated    Clinical Decision Making Moderate    Rehab Potential Good    PT Frequency 2x / week    PT Duration 8 weeks    PT Treatment/Interventions ADLs/Self Care Home Management;Cryotherapy;Electrical Stimulation;Moist Heat;Gait training;Functional mobility training;Therapeutic activities;Therapeutic exercise;Balance training;Patient/family education;Passive range of motion;Dry needling;Visual/perceptual remediation/compensation    PT Next Visit Plan commence low level abdominal strengthening, manual therapy to glutes/paraspinals? Reassess innominate isometrics    PT Home Exercise Plan At eval: Left STKC BID 2x60sec; walking twice daily or more    Consulted and Agree with Plan of Care Patient             Patient will benefit from skilled therapeutic intervention in order to improve the following deficits and impairments:  Decreased activity tolerance, Decreased balance, Decreased knowledge of use of DME, Decreased mobility, Decreased safety awareness, Decreased range of motion, Decreased strength, Difficulty walking, Increased muscle spasms, Hypermobility, Decreased knowledge of precautions, Improper body mechanics, Obesity, Postural dysfunction  Visit Diagnosis: Sacroiliac pain  Acute bilateral low back pain without sciatica     Problem List Patient Active Problem List   Diagnosis Date Noted   Hypertension 07/31/2019   Anxiety 07/31/2019   Hernia of anterior abdominal wall 09/29/2015   Obesity, unspecified 08/26/2014   Durwin Reges DPT Durwin Reges 01/05/2021, 2:26 PM  [facility] [address] [place], [address] Phone: [phone]   Fax:  [phone]  Name: Tina Young MRN: [id number] Date of Birth: Jun 18, 1991

## 2021-01-07 ENCOUNTER — Encounter: Payer: Self-pay | Admitting: Physical Therapy

## 2021-01-07 ENCOUNTER — Ambulatory Visit: Payer: 59 | Admitting: Physical Therapy

## 2021-01-07 ENCOUNTER — Other Ambulatory Visit: Payer: Self-pay

## 2021-01-07 ENCOUNTER — Encounter: Payer: 59 | Admitting: Physical Therapy

## 2021-01-07 DIAGNOSIS — M545 Low back pain, unspecified: Secondary | ICD-10-CM

## 2021-01-07 DIAGNOSIS — M533 Sacrococcygeal disorders, not elsewhere classified: Secondary | ICD-10-CM

## 2021-01-07 DIAGNOSIS — R293 Abnormal posture: Secondary | ICD-10-CM | POA: Diagnosis not present

## 2021-01-07 NOTE — Therapy (Signed)
[facility] [address] [place], [address] Phone: [phone]   Fax:  [phone]  Physical Therapy Treatment  Patient Details  Name: Tina Young MRN: [id number] Date of Birth: 1991/07/07 Referring Provider (PT): Beverlyn Roux MD   Encounter Date: 01/07/2021   PT End of Session - 01/07/21 1009     Visit Number 9    Number of Visits 17    Date for PT Re-Evaluation 01/04/21    Authorization Type [employer]- [employer]    Authorization Time Period 12/07/20-02/01/21    Authorization - Visit Number 9    Authorization - Number of Visits 10    PT Start Time [phone]    PT Stop Time 0623    PT Time Calculation (min) 38 min    Activity Tolerance Patient tolerated treatment well;No increased pain    Behavior During Therapy WFL for tasks assessed/performed             Past Medical History:  Diagnosis Date   Anxiety    GERD (gastroesophageal reflux disease)    Hypertension     Past Surgical History:  Procedure Laterality Date   CESAREAN SECTION      There were no vitals filed for this visit.   Subjective Assessment - 01/07/21 0953     Subjective Pt is awaiting pelvic MRI to be scheduled as lumbar MRI reports "edema from possible sacrum fx", has been taking it easy d/t worry from this. Reports her pain is 5/10 today. She felt better after manual and ESTIM following last visit.    Pertinent History Pt felt a pop in her back when working on 3/31 with subsequent locking up of her low back. Pt has had pain henceforth pointing to Left SIJ area and Right mid lumbar area. Pt also reports concurrent Rt scapular pain and Left neck pain. In exam pt also has pain in the right lateral and posterolateral iliac fossa. Pt reports history of chornic peristent back pain which includes muscle spasms going back to highschool. Pt has not had any imaging. She denies any frank burning or electrical pain, denies paresthesias or  gross motor loss.    How long can you sit comfortably? not limited specifically    How long can you stand comfortably? not limited specifically    How long can you walk comfortably? not limited    Diagnostic tests No imaging done    Patient Stated Goals return to work             Ther-Ex Nustep seat 6 UE 12 L3 54mins for gentle lumbar rotation and stregthening SPM >40 Hooklying lower trunk rotation x20 with min cuing for technique SKTC hookyling 30sec; SKTC CLLE ext 30sec Theraball roll outs x12 with cuing for breath control      Manual STM with trigger point release to R QL, and lumbar paraspinals Following: Dry Needling: (1/1) 39mm .25 needles placed along the R lumbar paraspinals (shelving technique) and multifidi (inf/med 2 finger breadths from spinous process) to decrease increased muscular spasms and trigger points with the patient positioned in supine. Patient was educated on risks and benefits of therapy and verbally consents to PT.     ESTIM + heat pack HiVolt ESTIM 10 min at patient tolerated 250V increased to 280V through treatment at R lumbar paraspinals/QL area . Attempted d/t success of treatment at previous session success. With PT assessing patient tolerance throughout (increasing intensity as needed), monitoring skin integrity (  normal), with decreased pain noted from patient                             PT Education - 01/07/21 1008     Education Details therex form/technique    Person(s) Educated Patient    Methods Explanation;Verbal cues;Demonstration    Comprehension Verbalized understanding;Verbal cues required;Returned demonstration              PT Short Term Goals - 12/07/20 1415       PT SHORT TERM GOAL #1   Title After 4 weeks p tto show FOTO score >55    Baseline 46 at evaluation    Time 4    Period Weeks    Status New    Target Date 01/04/21      PT SHORT TERM GOAL #2   Title After 4 weeks pt to demonstrate SLS  balance without exacerbation of pain >30sec bilat    Baseline Eval: ~15sec bialt, pain on Right lateral and right posterior iliac crest    Time 4    Period Weeks    Status New    Target Date 01/04/21      PT SHORT TERM GOAL #3   Title After 4 weeks pt to demonstrate improved self selected gait speed in 10MWT >1.0    Baseline Eval: 0.68m/s    Time 4    Period Weeks    Status New    Target Date 01/04/21               PT Long Term Goals - 12/07/20 1418       PT LONG TERM GOAL #1   Title After 8 weeks pt to demonstrate pt to show inproved FOTO survey score >65 to demonstrate improved pt perception of facility in ADL performance.    Baseline 46 at eval    Time 8    Period Weeks    Status New    Target Date 02/01/21      PT LONG TERM GOAL #2   Title After 8 weeks pt to demonstrate 5xSTS in <10 sec, 30sec chair rise >15x without exacerbation of power.    Baseline at eval: 5xSTS: 17sec (painful)    Time 8    Period Weeks    Status New    Target Date 02/01/21      PT LONG TERM GOAL #3   Title After 8 weeks pt to demonstrate improved deadbug posterior pelvic tilt isometric endurance >60sec to show improved function of abdominals.    Baseline 13.7sec at eval    Time 8    Period Weeks    Status New    Target Date 02/01/21      PT LONG TERM GOAL #4   Title After 8 weeks pt to demonstrate seated lumbothroacic rotation >65 degrees bilat without pain exacerbation.    Time 8    Period Weeks    Status New    Target Date 02/01/21                   Plan - 01/07/21 1028     Clinical Impression Statement PT with continued focus of pain reduction, with patient able to display more normalized gait followin management techniques. PT forewent therex progression, awaitiing further imagining to confirm/deny pelvic fracture. Pt is able to comply with all cuing, reports she is having a better day pain wise over all. PT will continue progression as able.  Personal Factors and  Comorbidities Age;Fitness;Time since onset of injury/illness/exacerbation    Examination-Activity Limitations Bend;Reach Overhead;Carry;Lift;Toileting;Stand;Sit;Sleep;Squat;Stairs;Transfers    Examination-Participation Restrictions Community Activity;Cleaning;Driving;Occupation    Stability/Clinical Decision Making Stable/Uncomplicated    Clinical Decision Making Moderate    Rehab Potential Good    PT Frequency 2x / week    PT Duration 8 weeks    PT Treatment/Interventions ADLs/Self Care Home Management;Cryotherapy;Electrical Stimulation;Moist Heat;Gait training;Functional mobility training;Therapeutic activities;Therapeutic exercise;Balance training;Patient/family education;Passive range of motion;Dry needling;Visual/perceptual remediation/compensation    PT Next Visit Plan commence low level abdominal strengthening, manual therapy to glutes/paraspinals? Reassess innominate isometrics    PT Home Exercise Plan At eval: Left STKC BID 2x60sec; walking twice daily or more    Consulted and Agree with Plan of Care Patient             Patient will benefit from skilled therapeutic intervention in order to improve the following deficits and impairments:  Decreased activity tolerance, Decreased balance, Decreased knowledge of use of DME, Decreased mobility, Decreased safety awareness, Decreased range of motion, Decreased strength, Difficulty walking, Increased muscle spasms, Hypermobility, Decreased knowledge of precautions, Improper body mechanics, Obesity, Postural dysfunction  Visit Diagnosis: Sacroiliac pain  Acute bilateral low back pain without sciatica  Abnormal posture     Problem List Patient Active Problem List   Diagnosis Date Noted   Hypertension 07/31/2019   Anxiety 07/31/2019   Hernia of anterior abdominal wall 09/29/2015   Obesity, unspecified 08/26/2014   Durwin Reges DPT Durwin Reges 01/07/2021, 10:36 AM  [facility] [address], [place], [address] Phone: [phone]   Fax:  [phone]  Name: BERDA SHELVIN MRN: [id number] Date of Birth: [birth year]

## 2021-01-12 ENCOUNTER — Encounter: Payer: Self-pay | Admitting: Physical Therapy

## 2021-01-12 ENCOUNTER — Ambulatory Visit: Payer: 59 | Admitting: Physical Therapy

## 2021-01-12 DIAGNOSIS — R293 Abnormal posture: Secondary | ICD-10-CM

## 2021-01-12 DIAGNOSIS — M545 Low back pain, unspecified: Secondary | ICD-10-CM | POA: Diagnosis not present

## 2021-01-12 DIAGNOSIS — M533 Sacrococcygeal disorders, not elsewhere classified: Secondary | ICD-10-CM

## 2021-01-12 NOTE — Therapy (Signed)
[facility] [address], [place], [address] Phone: [phone]   Fax:  [phone]  Physical Therapy Treatment  Patient Details  Name: Tina Young MRN: [id number] Date of Birth: 1990/12/25 Referring Provider (PT): Beverlyn Roux MD   Encounter Date: 01/12/2021   PT End of Session - 01/12/21 1310     Visit Number 10    Number of Visits 17    Date for PT Re-Evaluation 01/04/21    Authorization Type [employer]- [employer]    Authorization Time Period 12/07/20-02/01/21    Authorization - Visit Number 10    Authorization - Number of Visits 10    PT Start Time 8003    PT Stop Time 4917    PT Time Calculation (min) 39 min    Activity Tolerance Patient tolerated treatment well;No increased pain    Behavior During Therapy WFL for tasks assessed/performed             Past Medical History:  Diagnosis Date   Anxiety    GERD (gastroesophageal reflux disease)    Hypertension     Past Surgical History:  Procedure Laterality Date   CESAREAN SECTION      There were no vitals filed for this visit.   Subjective Assessment - 01/12/21 1307     Subjective Pt is contining to await MRI. Reports temporary pain relief with ESTIM and TDN last session. She reports pain today is bilat, and she had 10/10 pain over the weekend she could not shake.    Pertinent History Pt felt a pop in her back when working on 3/31 with subsequent locking up of her low back. Pt has had pain henceforth pointing to Left SIJ area and Right mid lumbar area. Pt also reports concurrent Rt scapular pain and Left neck pain. In exam pt also has pain in the right lateral and posterolateral iliac fossa. Pt reports history of chornic peristent back pain which includes muscle spasms going back to highschool. Pt has not had any imaging. She denies any frank burning or electrical pain, denies paresthesias or gross motor loss.    How long can you sit  comfortably? not limited specifically    How long can you stand comfortably? not limited specifically    How long can you walk comfortably? not limited    Diagnostic tests No imaging done    Patient Stated Goals return to work            Ther-Ex Nustep seat 6 UE 12 L3 52mins for gentle lumbar rotation and stregthening SPM >40 Hooklying lower trunk rotation x20 with min cuing for technique SKTC hookyling 30sec; SKTC CLLE ext 30sec Theraball roll outs x12 with cuing for breath control       Manual STM with trigger point release to R QL, and lumbar paraspinals Following: Dry Needling: (1/1) 28mm .25 needles placed along the R lumbar paraspinals (shelving technique) and multifidi (inf/med 2 finger breadths from spinous process) to decrease increased muscular spasms and trigger points with the patient positioned in supine. Patient was educated on risks and benefits of therapy and verbally consents to PT.     ESTIM + heat pack HiVolt ESTIM 10 min at patient tolerated 250V increased to 280V through treatment at R lumbar paraspinals/QL area . Attempted d/t success of treatment at previous session success. With PT assessing patient tolerance throughout (increasing intensity as needed), monitoring skin integrity (normal), with decreased pain noted from patient  PT Education - 01/12/21 1309     Education Details therex form/technique    Person(s) Educated Patient    Methods Explanation;Demonstration;Verbal cues    Comprehension Verbalized understanding;Returned demonstration;Verbal cues required              PT Short Term Goals - 01/12/21 1336       PT SHORT TERM GOAL #1   Title After 4 weeks p tto show FOTO score >55    Baseline 46 at evaluation    Time 4    Period Weeks    Status Deferred      PT SHORT TERM GOAL #2   Title After 4 weeks pt to demonstrate SLS balance without exacerbation of pain >30sec bilat    Baseline Eval:  ~15sec bialt, pain on Right lateral and right posterior iliac crest; 01/12/21 L 23sec no pain R 8sec with pain    Time 4    Period Weeks    Status On-going      PT SHORT TERM GOAL #3   Title After 4 weeks pt to demonstrate improved self selected gait speed in 10MWT >1.0    Baseline Eval: 0.52m/s; 01/12/21 less than 1.54m/s    Time 4    Period Weeks    Status On-going    Target Date 01/04/21               PT Long Term Goals - 12/07/20 1418       PT LONG TERM GOAL #1   Title After 8 weeks pt to demonstrate pt to show inproved FOTO survey score >65 to demonstrate improved pt perception of facility in ADL performance.    Baseline 46 at eval    Time 8    Period Weeks    Status New    Target Date 02/01/21      PT LONG TERM GOAL #2   Title After 8 weeks pt to demonstrate 5xSTS in <10 sec, 30sec chair rise >15x without exacerbation of power.    Baseline at eval: 5xSTS: 17sec (painful)    Time 8    Period Weeks    Status New    Target Date 02/01/21      PT LONG TERM GOAL #3   Title After 8 weeks pt to demonstrate improved deadbug posterior pelvic tilt isometric endurance >60sec to show improved function of abdominals.    Baseline 13.7sec at eval    Time 8    Period Weeks    Status New    Target Date 02/01/21      PT LONG TERM GOAL #4   Title After 8 weeks pt to demonstrate seated lumbothroacic rotation >65 degrees bilat without pain exacerbation.    Time 8    Period Weeks    Status New    Target Date 02/01/21                   Plan - 01/12/21 1325     Clinical Impression Statement PT continued focus of pain reduction, with success. PT advised patient to place POC on hold until next MRI as patient is still having intermittent emergent pain, and initial MRI was ambiguous. Pt verbalizes understanding of HEP for pain management with therex until then. Patient reports reduced pain to 6/10 following TDN and modalities. PT will continue progression as able.     Personal Factors and Comorbidities Age;Fitness;Time since onset of injury/illness/exacerbation    Examination-Activity Limitations Bend;Reach Overhead;Carry;Lift;Toileting;Stand;Sit;Sleep;Squat;Stairs;Transfers    Examination-Participation Restrictions Community Activity;Cleaning;Driving;Occupation  Stability/Clinical Decision Making Stable/Uncomplicated    Clinical Decision Making Moderate    Rehab Potential Good    PT Frequency 2x / week    PT Duration 8 weeks    PT Treatment/Interventions ADLs/Self Care Home Management;Cryotherapy;Electrical Stimulation;Moist Heat;Gait training;Functional mobility training;Therapeutic activities;Therapeutic exercise;Balance training;Patient/family education;Passive range of motion;Dry needling;Visual/perceptual remediation/compensation    PT Next Visit Plan commence low level abdominal strengthening, manual therapy to glutes/paraspinals? Reassess innominate isometrics    PT Home Exercise Plan At eval: Left STKC BID 2x60sec; walking twice daily or more    Consulted and Agree with Plan of Care Patient             Patient will benefit from skilled therapeutic intervention in order to improve the following deficits and impairments:  Decreased activity tolerance, Decreased balance, Decreased knowledge of use of DME, Decreased mobility, Decreased safety awareness, Decreased range of motion, Decreased strength, Difficulty walking, Increased muscle spasms, Hypermobility, Decreased knowledge of precautions, Improper body mechanics, Obesity, Postural dysfunction  Visit Diagnosis: Sacroiliac pain  Acute bilateral low back pain without sciatica  Abnormal posture     Problem List Patient Active Problem List   Diagnosis Date Noted   Hypertension 07/31/2019   Anxiety 07/31/2019   Hernia of anterior abdominal wall 09/29/2015   Obesity, unspecified 08/26/2014   Durwin Reges DPT Durwin Reges 01/12/2021, 1:47 PM  [facility] [address], [place], [address] Phone: [phone]   Fax:  [phone]  Name: Tina Young MRN: [id number] Date of Birth: 31-Jan-1991

## 2021-01-14 ENCOUNTER — Encounter: Payer: 59 | Admitting: Physical Therapy

## 2021-01-14 ENCOUNTER — Ambulatory Visit: Payer: 59 | Admitting: Physical Therapy

## 2021-01-19 ENCOUNTER — Ambulatory Visit: Payer: 59 | Admitting: Physical Therapy

## 2021-01-20 DIAGNOSIS — F411 Generalized anxiety disorder: Secondary | ICD-10-CM | POA: Diagnosis not present

## 2021-01-20 DIAGNOSIS — M5441 Lumbago with sciatica, right side: Secondary | ICD-10-CM | POA: Diagnosis not present

## 2021-01-20 DIAGNOSIS — M533 Sacrococcygeal disorders, not elsewhere classified: Secondary | ICD-10-CM | POA: Diagnosis not present

## 2021-01-20 DIAGNOSIS — Z1389 Encounter for screening for other disorder: Secondary | ICD-10-CM | POA: Diagnosis not present

## 2021-01-20 DIAGNOSIS — M546 Pain in thoracic spine: Secondary | ICD-10-CM | POA: Diagnosis not present

## 2021-01-20 DIAGNOSIS — Z712 Person consulting for explanation of examination or test findings: Secondary | ICD-10-CM | POA: Diagnosis not present

## 2021-01-21 ENCOUNTER — Ambulatory Visit: Payer: 59 | Admitting: Physical Therapy

## 2021-01-29 ENCOUNTER — Encounter: Payer: 59 | Admitting: Physical Therapy

## 2021-02-04 ENCOUNTER — Encounter: Payer: 59 | Admitting: Physical Therapy

## 2021-02-09 DIAGNOSIS — M533 Sacrococcygeal disorders, not elsewhere classified: Secondary | ICD-10-CM | POA: Diagnosis not present

## 2021-02-09 DIAGNOSIS — M546 Pain in thoracic spine: Secondary | ICD-10-CM | POA: Diagnosis not present

## 2021-02-09 DIAGNOSIS — M5441 Lumbago with sciatica, right side: Secondary | ICD-10-CM | POA: Diagnosis not present

## 2021-02-09 DIAGNOSIS — Z712 Person consulting for explanation of examination or test findings: Secondary | ICD-10-CM | POA: Diagnosis not present

## 2021-02-13 NOTE — Addendum Note (Signed)
Encounter addended by: Annie Paras on: 02/13/2021 3:47 PM  Actions taken: Letter saved

## 2021-02-17 ENCOUNTER — Other Ambulatory Visit: Payer: Self-pay | Admitting: Family Medicine

## 2021-02-17 ENCOUNTER — Other Ambulatory Visit (HOSPITAL_COMMUNITY): Payer: Self-pay | Admitting: Family Medicine

## 2021-02-17 DIAGNOSIS — M5441 Lumbago with sciatica, right side: Secondary | ICD-10-CM

## 2021-02-17 DIAGNOSIS — G8929 Other chronic pain: Secondary | ICD-10-CM

## 2021-02-17 DIAGNOSIS — M533 Sacrococcygeal disorders, not elsewhere classified: Secondary | ICD-10-CM

## 2021-02-23 ENCOUNTER — Other Ambulatory Visit: Payer: Self-pay

## 2021-02-23 ENCOUNTER — Ambulatory Visit
Admission: RE | Admit: 2021-02-23 | Discharge: 2021-02-23 | Disposition: A | Discharge status: Home / Self Care | Payer: 59 | Source: Ambulatory Visit | Attending: Family Medicine | Admitting: Family Medicine

## 2021-02-23 DIAGNOSIS — M533 Sacrococcygeal disorders, not elsewhere classified: Secondary | ICD-10-CM | POA: Diagnosis not present

## 2021-02-23 DIAGNOSIS — M5441 Lumbago with sciatica, right side: Secondary | ICD-10-CM | POA: Insufficient documentation

## 2021-02-23 DIAGNOSIS — D1800 Hemangioma unspecified site: Secondary | ICD-10-CM | POA: Diagnosis not present

## 2021-02-23 DIAGNOSIS — G8929 Other chronic pain: Secondary | ICD-10-CM | POA: Insufficient documentation

## 2021-02-23 DIAGNOSIS — R600 Localized edema: Secondary | ICD-10-CM | POA: Diagnosis not present

## 2021-02-23 DIAGNOSIS — M898X8 Other specified disorders of bone, other site: Secondary | ICD-10-CM | POA: Diagnosis not present

## 2021-02-23 DIAGNOSIS — M546 Pain in thoracic spine: Secondary | ICD-10-CM | POA: Insufficient documentation

## 2021-02-24 DIAGNOSIS — Z20822 Contact with and (suspected) exposure to covid-19: Secondary | ICD-10-CM | POA: Diagnosis not present

## 2021-03-11 DIAGNOSIS — K047 Periapical abscess without sinus: Secondary | ICD-10-CM | POA: Diagnosis not present

## 2021-03-11 DIAGNOSIS — Z113 Encounter for screening for infections with a predominantly sexual mode of transmission: Secondary | ICD-10-CM | POA: Diagnosis not present

## 2021-03-11 DIAGNOSIS — Z0000 Encounter for general adult medical examination without abnormal findings: Secondary | ICD-10-CM | POA: Diagnosis not present

## 2021-03-11 DIAGNOSIS — Z23 Encounter for immunization: Secondary | ICD-10-CM | POA: Diagnosis not present

## 2021-03-11 DIAGNOSIS — R590 Localized enlarged lymph nodes: Secondary | ICD-10-CM | POA: Diagnosis not present

## 2021-03-24 ENCOUNTER — Other Ambulatory Visit: Payer: Self-pay

## 2021-03-24 ENCOUNTER — Ambulatory Visit (LOCAL_COMMUNITY_HEALTH_CENTER): Payer: Medicaid Other | Admitting: Advanced Practice Midwife

## 2021-03-24 ENCOUNTER — Encounter: Payer: Self-pay | Admitting: Advanced Practice Midwife

## 2021-03-24 VITALS — BP 127/84 | HR 108 | Temp 98.7°F | Resp 18 | Ht 62.0 in | Wt 207.0 lb

## 2021-03-24 DIAGNOSIS — Z3009 Encounter for other general counseling and advice on contraception: Secondary | ICD-10-CM | POA: Diagnosis not present

## 2021-03-24 DIAGNOSIS — Z3202 Encounter for pregnancy test, result negative: Secondary | ICD-10-CM

## 2021-03-24 DIAGNOSIS — Z30013 Encounter for initial prescription of injectable contraceptive: Secondary | ICD-10-CM | POA: Diagnosis not present

## 2021-03-24 LAB — PREGNANCY, URINE: Preg Test, Ur: NEGATIVE

## 2021-03-24 NOTE — Progress Notes (Addendum)
Patient here for acute visit for depo injection. Last depo was given 11/16/20 (18w 2d post depo).   Last sex 03/01/21. Last physical 11/16/20. Pregnancy test negative today and reviewed by Ola Spurr, CNM.   Depo 150 mg IM given LUOQ per order of C. [place], [place] on 11/16/20 and verbal okay from provider today E.Sciora, CNM.   Tolerated well.  Next depo due earliest 06/09/21, reminder card given.   Reiterated the importance of keeping depo appointments every 11-13 weeks.   Adalberto Cole, RN   Adalberto Cole, RN

## 2021-03-26 NOTE — Addendum Note (Signed)
Encounter addended by: Annie Paras on: 03/26/2021 11:18 AM  Actions taken: Letter saved

## 2021-04-11 DIAGNOSIS — S8992XA Unspecified injury of left lower leg, initial encounter: Secondary | ICD-10-CM | POA: Diagnosis not present

## 2021-04-11 DIAGNOSIS — M25562 Pain in left knee: Secondary | ICD-10-CM | POA: Diagnosis not present

## 2021-04-14 DIAGNOSIS — M79672 Pain in left foot: Secondary | ICD-10-CM | POA: Diagnosis not present

## 2021-06-21 ENCOUNTER — Ambulatory Visit (LOCAL_COMMUNITY_HEALTH_CENTER): Payer: Medicaid Other

## 2021-06-21 ENCOUNTER — Other Ambulatory Visit: Payer: Self-pay

## 2021-06-21 VITALS — BP 139/91 | Ht 62.0 in | Wt 213.5 lb

## 2021-06-21 DIAGNOSIS — Z3042 Encounter for surveillance of injectable contraceptive: Secondary | ICD-10-CM

## 2021-06-21 DIAGNOSIS — Z3009 Encounter for other general counseling and advice on contraception: Secondary | ICD-10-CM

## 2021-06-21 NOTE — Progress Notes (Addendum)
12 weeks 5 days post depo.BP elevated today 139/91. On BP meds which she took today and has regular f- u with [facility]. Plans to schedule return visit soon. Reports period and cramping starting 06/19/2021 . States this is first period she has had in years. Consult E Sciora, CNM who recommends pt to continue f- u with PCP for HBP and recommends taking  ibuprofen for cramping as needed. Provider gives ok for depo today. RN carried out provider orders. Depo given without difficulty RUOQ. Next depo due 09/06/2021, pt aware. Josie Saunders, RN  Consulted on the plan of care for this client.  I agree with the documented note and actions taken to provide care for this client.  Ola Spurr, CNM

## 2021-09-10 ENCOUNTER — Other Ambulatory Visit: Payer: Self-pay

## 2021-09-10 ENCOUNTER — Ambulatory Visit (LOCAL_COMMUNITY_HEALTH_CENTER): Payer: Medicaid Other

## 2021-09-10 VITALS — BP 133/92 | Ht 62.0 in | Wt 217.5 lb

## 2021-09-10 DIAGNOSIS — Z3042 Encounter for surveillance of injectable contraceptive: Secondary | ICD-10-CM | POA: Diagnosis not present

## 2021-09-10 DIAGNOSIS — Z3009 Encounter for other general counseling and advice on contraception: Secondary | ICD-10-CM | POA: Diagnosis not present

## 2021-09-10 NOTE — Progress Notes (Signed)
11 weeks 4 days post depo. BP elevated today 133/92. On BP meds. Has regular f-u with PCP at [facility] Most recent visit to PCP this week and pt reports no changes in BP meds.   Reports irregular vaginal bleeding  "on and off" ranging from light to heavy with clots. Experiencing cramping that is bothersome. Takes ibuprofen prn cramping.  Consult Graciela Husbands who gives ok for depo today. Recommends pt to schedule STI clinic appt to evaluate for/ rule out  infection. Advises pt to continue f-u with PCP for BP concerns.   RN carried out provider orders. Tolerated depo well LUOQ. Plans to have RP when next depo due, approx 11/26/2021. Pt agrees to have STI appt and sent to clerk to schedule. Advised to contact [facility] with questions, concerns. Josie Saunders, RN

## 2021-09-13 NOTE — Progress Notes (Signed)
Consulted by RN re: patient situation.  Reviewed RN note and agree that it reflects our discussion and my recommendations.    Junious Dresser, FNP

## 2021-09-14 ENCOUNTER — Encounter: Payer: Self-pay | Admitting: Family Medicine

## 2021-09-14 ENCOUNTER — Ambulatory Visit: Payer: Medicaid Other | Admitting: Family Medicine

## 2021-09-14 ENCOUNTER — Other Ambulatory Visit: Payer: Self-pay

## 2021-09-14 DIAGNOSIS — A64 Unspecified sexually transmitted disease: Secondary | ICD-10-CM

## 2021-09-14 DIAGNOSIS — Z113 Encounter for screening for infections with a predominantly sexual mode of transmission: Secondary | ICD-10-CM

## 2021-09-14 DIAGNOSIS — B3731 Acute candidiasis of vulva and vagina: Secondary | ICD-10-CM

## 2021-09-14 LAB — WET PREP FOR TRICH, YEAST, CLUE: Trichomonas Exam: NEGATIVE

## 2021-09-14 LAB — HM HIV SCREENING LAB: HM HIV Screening: NEGATIVE

## 2021-09-14 MED ORDER — FLUCONAZOLE 150 MG PO TABS: 150.0000 mg | ORAL_TABLET | Freq: Once | ORAL | 0 refills | Status: AC

## 2021-09-14 NOTE — Progress Notes (Signed)
Here for STD testing, using Depo for [facility]. Patient self-collecting.Jenetta Downer, RN

## 2021-09-14 NOTE — Progress Notes (Signed)
[facility] Department  STI clinic/screening visit [place] [address] [phone]  Subjective:  Tina Young is a 31 y.o. female being seen today for an STI screening visit. The patient reports they do have symptoms.  Patient reports that they do not desire a pregnancy in the next year.   They reported they are not interested in discussing contraception today.    Patient's last menstrual period was 07/01/2021 (approximate).   Patient has the following medical conditions:   Patient Active Problem List   Diagnosis Date Noted   Hypertension 07/31/2019   Anxiety 07/31/2019   Hernia of anterior abdominal wall 09/29/2015   Obesity, unspecified 08/26/2014    Chief Complaint  Patient presents with   SEXUALLY TRANSMITTED DISEASE    Screening     HPI  Patient reports here for screening, reports s/sx   Pt does not remember when last HIV testing was.  Patient reports last pap was 07/31/2019.   Screening for MPX risk: Does the patient have an unexplained rash? No Is the patient MSM? No Does the patient endorse multiple sex partners or anonymous sex partners? No Did the patient have close or sexual contact with a person diagnosed with MPX? No Has the patient traveled outside the [place] where MPX is endemic? No Is there a high clinical suspicion for MPX-- evidenced by one of the following No  -Unlikely to be chickenpox  -Lymphadenopathy  -Rash that present in same phase of evolution on any given body part See flowsheet for further details and programmatic requirements.    The following portions of the patient's history were reviewed and updated as appropriate: allergies, current medications, past medical history, past social history, past surgical history and problem list.  Objective:  There were no vitals filed for this visit.  Physical Exam Vitals and nursing note reviewed.  Constitutional:      Appearance: Normal appearance.  HENT:      Head: Normocephalic and atraumatic.     Mouth/Throat:     Mouth: Mucous membranes are moist.     Pharynx: Oropharynx is clear. No oropharyngeal exudate or posterior oropharyngeal erythema.  Pulmonary:     Effort: Pulmonary effort is normal.  Abdominal:     General: Abdomen is flat.     Palpations: There is no mass.     Tenderness: There is no abdominal tenderness. There is no rebound.  Genitourinary:    Exam position: Lithotomy position.     Pubic Area: No rash or pubic lice.      Labia:        Right: No rash or lesion.        Left: No rash or lesion.      Vagina: Normal. No vaginal discharge, erythema, bleeding or lesions.     Cervix: No cervical motion tenderness, discharge, friability, lesion or erythema.     Uterus: Normal.      Adnexa: Right adnexa normal and left adnexa normal.     Comments: Pt self collected specimens  Lymphadenopathy:     Head:     Right side of head: No preauricular or posterior auricular adenopathy.     Left side of head: No preauricular or posterior auricular adenopathy.     Cervical: No cervical adenopathy.     Upper Body:     Right upper body: No supraclavicular or axillary adenopathy.     Left upper body: No supraclavicular or axillary adenopathy.     Lower Body: No  right inguinal adenopathy. No left inguinal adenopathy.  Skin:    General: Skin is warm and dry.     Findings: No rash.  Neurological:     Mental Status: She is alert and oriented to person, place, and time.  Psychiatric:        Mood and Affect: Mood normal.        Behavior: Behavior normal.     Assessment and Plan:  Tina Young is a 31 y.o. female presenting to the [facility] Department for STI screening  1. Sexually transmitted infection Patient accepted all screenings including wet prep, vaginal CT/GC and bloodwork for HIV/RPR.  Patient meets criteria for HepB screening? No. Ordered? No - does not meet criteria  Patient meets criteria for HepC screening?  No. Ordered? No - does not meet criteria   Wet prep results  + yeast    Treatment needed  Discussed time line for State Lab results and that patient will be called with positive results and encouraged patient to call if she had not heard in 2 weeks.  Counseled to return or seek care for continued or worsening symptoms Recommended condom use with all sex  Patient is currently using  Depo provera   to prevent pregnancy.   - HIV [place] [facility] - Syphilis Serology,  Lab - Chlamydia/Gonorrhea  Lab - WET PREP FOR [place], YEAST, CLUE  2. Yeast vaginitis RX sent to listed pharmacy   - fluconazole (DIFLUCAN) 150 MG tablet; Take 1 tablet (150 mg total) by mouth once for 1 dose.  Dispense: 1 tablet; Refill: 0     No follow-ups on file.  No future appointments.  Junious Dresser, FNP

## 2021-12-07 ENCOUNTER — Ambulatory Visit (LOCAL_COMMUNITY_HEALTH_CENTER): Payer: Medicaid Other | Admitting: Nurse Practitioner

## 2021-12-07 ENCOUNTER — Encounter: Payer: Self-pay | Admitting: Nurse Practitioner

## 2021-12-07 VITALS — BP 135/88 | Ht 62.0 in | Wt 229.6 lb

## 2021-12-07 DIAGNOSIS — Z3042 Encounter for surveillance of injectable contraceptive: Secondary | ICD-10-CM

## 2021-12-07 DIAGNOSIS — Z3009 Encounter for other general counseling and advice on contraception: Secondary | ICD-10-CM

## 2021-12-07 DIAGNOSIS — Z113 Encounter for screening for infections with a predominantly sexual mode of transmission: Secondary | ICD-10-CM

## 2021-12-07 DIAGNOSIS — Z01419 Encounter for gynecological examination (general) (routine) without abnormal findings: Secondary | ICD-10-CM

## 2021-12-07 LAB — WET PREP FOR TRICH, YEAST, CLUE
Trichomonas Exam: NEGATIVE
Yeast Exam: NEGATIVE

## 2021-12-07 LAB — HEPATITIS B SURFACE ANTIGEN

## 2021-12-07 LAB — HM HIV SCREENING LAB: HM HIV Screening: NEGATIVE

## 2021-12-07 LAB — HM HEPATITIS C SCREENING LAB: HM Hepatitis Screen: NEGATIVE

## 2021-12-07 MED ORDER — MEDROXYPROGESTERONE ACETATE 150 MG/ML IM SUSP
150.0000 mg | INTRAMUSCULAR | Status: AC
  Administered 2021-12-07 – 2022-08-29 (×4): 150 mg via INTRAMUSCULAR

## 2021-12-07 NOTE — Progress Notes (Signed)
Pt here for PE and DEPO. Depo administered per provider order and reminder card given. WET Prep negative. No treatment indicated.

## 2021-12-07 NOTE — Progress Notes (Signed)
[facility] [place] Number: [phone]    Family Planning Visit- Initial Visit  Subjective:  Tina Young is a 31 y.o.  G1P1001   being seen today for an initial annual visit and to discuss reproductive life planning.  The patient is currently using Hormonal Injection for pregnancy prevention. Patient reports   does not want a pregnancy in the next year.     report they are looking for a method that provides High efficacy at preventing pregnancy  Patient has the following medical conditions has Hernia of anterior abdominal wall; Obesity, unspecified; Hypertension; and Anxiety on their problem list.  Chief Complaint  Patient presents with   Annual Exam    PE and DEPO    Patient reports to clinic for a physical and continuation of Depo.    Body mass index is 41.99 kg/m. - Patient is eligible for diabetes screening based on BMI and age >46?  not applicable EV0J ordered? not applicable  Patient reports 2  partner/s in last year. Desires STI screening?  Yes  Has patient been screened once for HCV in the past?  No  No results found for: HCVAB  Does the patient have current drug use (including MJ), have a partner with drug use, and/or has been incarcerated since last result? No  If yes-- Screen for HCV through [facility]   Does the patient meet criteria for HBV testing? Yes  Criteria:  -Household, sexual or needle sharing contact with HBV -History of drug use -HIV positive -Those with known Hep C   Health Maintenance Due  Topic Date Due   COVID-19 Vaccine (1) Never done   Hepatitis C Screening  Never done    Review of Systems  Constitutional:  Negative for chills, fever, malaise/fatigue and weight loss.       Breast tenderness  Weight Fluctuation   HENT:  Negative for congestion, hearing loss and sore throat.   Eyes:  Negative for blurred vision, double vision and photophobia.  Respiratory:   Positive for shortness of breath.   Cardiovascular:  Negative for chest pain.  Gastrointestinal:  Positive for nausea. Negative for abdominal pain, blood in stool, constipation, diarrhea, heartburn and vomiting.  Genitourinary:  Positive for frequency. Negative for dysuria.       Genital itching   Musculoskeletal:  Negative for back pain, joint pain and neck pain.       Joint swelling   Skin:  Negative for itching and rash.  Neurological:  Positive for dizziness and headaches. Negative for weakness.  Endo/Heme/Allergies:  Does not bruise/bleed easily.  Psychiatric/Behavioral:  Negative for depression, substance abuse and suicidal ideas.    The following portions of the patient's history were reviewed and updated as appropriate: allergies, current medications, past family history, past medical history, past social history, past surgical history and problem list. Problem list updated.   See flowsheet for other program required questions.  Objective:   Vitals:   12/07/21 1452  BP: 135/88  Weight: 229 lb 9.6 oz (104.1 kg)  Height: '5\' 2"'$  (1.575 m)    Physical Exam Constitutional:      Appearance: Normal appearance.  HENT:     Head: Normocephalic.     Right Ear: External ear normal.     Left Ear: External ear normal.     Nose: Nose normal.     Mouth/Throat:     Lips: Pink.     Mouth: Mucous membranes are  moist.     Comments: No visible signs of dental caries  Eyes:     Pupils: Pupils are equal, round, and reactive to light.  Cardiovascular:     Rate and Rhythm: Normal rate and regular rhythm.  Pulmonary:     Effort: Pulmonary effort is normal.     Breath sounds: Normal breath sounds.  Chest:     Comments: Breasts:        Right: Normal. No swelling, mass, nipple discharge, skin change or tenderness.        Left: Normal. No swelling, mass, nipple discharge, skin change or tenderness.   Abdominal:     General: Abdomen is flat. Bowel sounds are normal.     Palpations:  Abdomen is soft.  Genitourinary:    Comments: External genitalia/pubic area without nits, lice, edema, erythema, lesions and inguinal adenopathy. Vagina with normal mucosa and discharge. Cervix without visible lesions. Uterus firm, mobile, nt, no masses, no CMT, no adnexal tenderness or fullness. pH 4.5. Musculoskeletal:     Cervical back: Full passive range of motion without pain, normal range of motion and neck supple.  Skin:    General: Skin is warm and dry.  Neurological:     Mental Status: She is alert and oriented to person, place, and time.  Psychiatric:        Attention and Perception: Attention normal.        Mood and Affect: Mood normal.        Speech: Speech normal.        Behavior: Behavior normal. Behavior is cooperative.      Assessment and Plan:  Tina Young is a 31 y.o. female presenting to the [facility] Department for an initial annual wellness/contraceptive visit  Contraception counseling: Reviewed options based on patient desire and reproductive life plan. Patient is interested in Hormonal Injection. This was provided to the patient today.   Risks, benefits, and typical effectiveness rates were reviewed.  Questions were answered.  Written information was also given to the patient to review.    The patient will follow up in  11 weeks for surveillance.  The patient was told to call with any further questions, or with any concerns about this method of contraception.  Emphasized use of condoms 100% of the time for STI prevention.  Need for ECP was assessed. No ECP offered today due to continuous use of birth control.    1. Family planning counseling -31 year old female in clinic today for a physical and STD screening. -ROS reviewed.  Patient with complaints of dizziness, shortness of breath with exertion, breast tenderness, vaginal itching, frequent urination, swollen joints, weight gain, nausea, and headache.  Patient agrees to STD screening today to  address concerns with vaginal itching. Reviewed proper hygiene and evaluation of underwear and detergent/soaps.   Normal breast tissue noted on physical exam, patient denies tenderness with palpation.  Recommended at least 6 small frequent meals and drink at least 6-8 bottles of water daily. Encouraged frequent exercise with at least 30 minutes of low impact exercises.  Recommendations will hopefully promote weight loss, improve headaches, and nausea.  Also recommended an up to date eye exam and obtaining a PCP to address any additional concerns.  -PHQ-9= 17, declined thoughts of self harm.  Patient states she has anxiety and continues to see her therapist.    2. Well woman exam with routine gynecological exam -Normal well woman exam. -CBE today, next due 11/2024 -PAP due 07/30/22  3. Surveillance for Depo-Provera contraception -Patient desires to continue with Depo.  May have Depo 150 MG q11-13 weeks x 1 year.   4. Screening examination for venereal disease -STD screening today. -Patient accepted all screenings including oral GC, vaginal CT/GC and bloodwork for HIV/RPR.  Patient meets criteria for HepB screening? Yes. Ordered? Yes Patient meets criteria for HepC screening? Yes. Ordered? Yes  Treat wet prep per standing order Discussed time line for State Lab results and that patient will be called with positive results and encouraged patient to call if she had not heard in 2 weeks.  Counseled to return or seek care for continued or worsening symptoms Recommended condom use with all sex  Patient is currently using Hormonal Contraception: Injection, Rings and Patches to prevent pregnancy.    - WET PREP FOR TRICH, YEAST, CLUE - Gonococcus culture - HIV/HCV [place] [facility] - Syphilis Serology, [place] [facility] - HBV Antigen/Antibody State Lab - [place] [facility]    Return in about 11 weeks (around 02/22/2022) for Routine DMPA injection.   Gregary Cromer, FNP

## 2021-12-11 LAB — GONOCOCCUS CULTURE

## 2022-03-04 ENCOUNTER — Ambulatory Visit (LOCAL_COMMUNITY_HEALTH_CENTER): Payer: Medicaid Other | Admitting: Family Medicine

## 2022-03-04 VITALS — BP 136/90 | Ht 62.0 in | Wt 232.4 lb

## 2022-03-04 DIAGNOSIS — Z30013 Encounter for initial prescription of injectable contraceptive: Secondary | ICD-10-CM | POA: Diagnosis not present

## 2022-03-04 DIAGNOSIS — Z3042 Encounter for surveillance of injectable contraceptive: Secondary | ICD-10-CM

## 2022-03-04 DIAGNOSIS — Z309 Encounter for contraceptive management, unspecified: Secondary | ICD-10-CM | POA: Diagnosis not present

## 2022-03-04 NOTE — Progress Notes (Signed)
Pt here for Depo injection only.  Last Depo given 12/07/2021 (12w 3d).   Depo 150 mg given IM in RUOQ.  Pt tolerated well.  Pt given reminder to return in 11-13 weeks for next Depo. Pt was not seen by a Provider.   Windle Guard, RN

## 2022-04-26 ENCOUNTER — Ambulatory Visit: Payer: Medicaid Other | Admitting: Family Medicine

## 2022-04-26 DIAGNOSIS — Z113 Encounter for screening for infections with a predominantly sexual mode of transmission: Secondary | ICD-10-CM | POA: Diagnosis not present

## 2022-04-26 LAB — WET PREP FOR TRICH, YEAST, CLUE: Trichomonas Exam: NEGATIVE

## 2022-04-26 LAB — HM HIV SCREENING LAB: HM HIV Screening: NEGATIVE

## 2022-04-26 LAB — HM HEPATITIS C SCREENING LAB: HM Hepatitis Screen: NEGATIVE

## 2022-04-26 NOTE — Progress Notes (Signed)
[facility] Department  STI clinic/screening visit [place] [address] [phone]  Subjective:  Tina Young is a 31 y.o. female being seen today for an STI screening visit. The patient reports they do have symptoms.  Patient reports that they do not desire a pregnancy in the next year.   They reported they are not interested in discussing contraception today.    No LMP recorded. Patient has had an injection.   Patient has the following medical conditions:   Patient Active Problem List   Diagnosis Date Noted   Hypertension 07/31/2019   Anxiety 07/31/2019   Hernia of anterior abdominal wall 09/29/2015   Obesity, unspecified 08/26/2014    Chief Complaint  Patient presents with   SEXUALLY TRANSMITTED DISEASE    Screening patient complains of vaginally irritation, itching and discharge     HPI  Patient reports vaginal irritation for about 1 week.  Last HIV test per patient/review of record was 5/23 Patient reports last pap was 2021-- NIL, but positive for Trich.   Screening for MPX risk: Does the patient have an unexplained rash? No Is the patient MSM? No Does the patient endorse multiple sex partners or anonymous sex partners? No Did the patient have close or sexual contact with a person diagnosed with MPX? No Has the patient traveled outside the [place] where MPX is endemic? No Is there a high clinical suspicion for MPX-- evidenced by one of the following No  -Unlikely to be chickenpox  -Lymphadenopathy  -Rash that present in same phase of evolution on any given body part See flowsheet for further details and programmatic requirements.   Immunization history:  Immunization History  Administered Date(s) Administered   HPV 9-valent 04/16/2015   Hepatitis B 05/11/2001, 06/08/2001, 11/16/2001   Hpv-Unspecified 08/26/2014, 11/10/2014   Tdap 03/28/2012     The following portions of the patient's history were reviewed and updated as  appropriate: allergies, current medications, past medical history, past social history, past surgical history and problem list.  Objective:  There were no vitals filed for this visit.  Physical Exam Vitals and nursing note reviewed.  Constitutional:      Appearance: Normal appearance.  HENT:     Head: Normocephalic and atraumatic.     Mouth/Throat:     Mouth: Mucous membranes are moist.     Pharynx: Oropharynx is clear. No oropharyngeal exudate or posterior oropharyngeal erythema.  Pulmonary:     Effort: Pulmonary effort is normal.  Abdominal:     General: Abdomen is flat.     Palpations: There is no mass.     Tenderness: There is no abdominal tenderness. There is no rebound.  Genitourinary:    General: Normal vulva.     Exam position: Lithotomy position.     Pubic Area: No rash or pubic lice.      Labia:        Right: No rash or lesion.        Left: No rash or lesion.      Vagina: Normal. No vaginal discharge, erythema, bleeding or lesions.     Cervix: No cervical motion tenderness, discharge, friability, lesion or erythema.     Uterus: Normal.      Adnexa: Right adnexa normal and left adnexa normal.     Rectum: Normal.     Comments: pH = <4.5 Lymphadenopathy:     Head:     Right side of head: No preauricular or posterior auricular adenopathy.  Left side of head: No preauricular or posterior auricular adenopathy.     Cervical: No cervical adenopathy.     Upper Body:     Right upper body: No supraclavicular, axillary or epitrochlear adenopathy.     Left upper body: No supraclavicular, axillary or epitrochlear adenopathy.     Lower Body: No right inguinal adenopathy. No left inguinal adenopathy.  Skin:    General: Skin is warm and dry.     Findings: No rash.  Neurological:     Mental Status: She is alert and oriented to person, place, and time.      Assessment and Plan:  Tina Young is a 31 y.o. female presenting to the [facility] Department for  STI screening  1. Screening examination for venereal disease Treat wet mount per standing Reviewed use of condoms Review timing for result return Collected oral and vaginal CT/GC - WET PREP FOR TRICH, YEAST, CLUE - Chlamydia/Gonorrhea [place] [facility] - Syphilis Serology, [place] [facility] - HIV/HCV [place] [facility]   Return if symptoms worsen or fail to improve.  No future appointments.  Caren Macadam, MD

## 2022-05-03 ENCOUNTER — Encounter: Payer: Self-pay | Admitting: Family Medicine

## 2022-06-06 ENCOUNTER — Ambulatory Visit (LOCAL_COMMUNITY_HEALTH_CENTER): Payer: Medicaid Other

## 2022-06-06 VITALS — BP 133/75 | Ht 62.0 in | Wt 239.0 lb

## 2022-06-06 DIAGNOSIS — Z309 Encounter for contraceptive management, unspecified: Secondary | ICD-10-CM

## 2022-06-06 DIAGNOSIS — Z3009 Encounter for other general counseling and advice on contraception: Secondary | ICD-10-CM

## 2022-06-06 DIAGNOSIS — Z3042 Encounter for surveillance of injectable contraceptive: Secondary | ICD-10-CM

## 2022-06-06 NOTE — Progress Notes (Signed)
13 weeks 3 days post depo. Voices no concerns. Depo given today per order by Collene Leyden, FNP dated 12/07/2021. Tolerated well LUOQ. Next depo due 08/22/2022, has reminder. Josie Saunders, RN

## 2022-08-28 IMAGING — MR MR LUMBAR SPINE W/O CM
5 series · 31 of 48 positions shown · non-contrast
Comparison: None.

CLINICAL DATA: Muscle spasm of back.

EXAM:
MRI LUMBAR SPINE WITHOUT CONTRAST
TECHNIQUE: Multiplanar, multisequence MR imaging of the lumbar spine was
performed. No intravenous contrast was administered.

[Series 1: T2 · sagittal · 4.0mm · 0.81mm/px · 6 of 17 slices shown (1 of 2)]
[im 1/17]
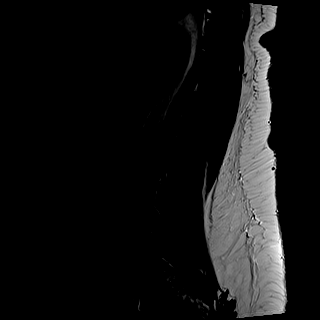
[im 4/17]
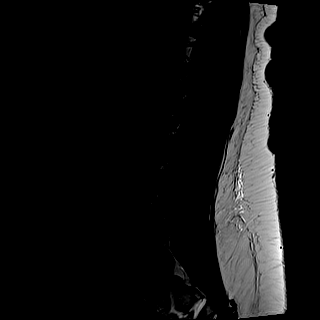
[im 7/17]
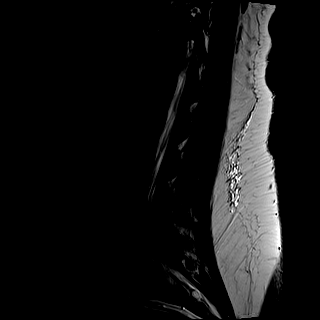
[im 10/17]
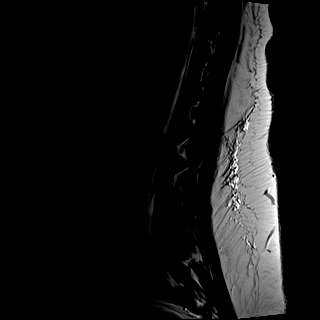
[im 13/17]
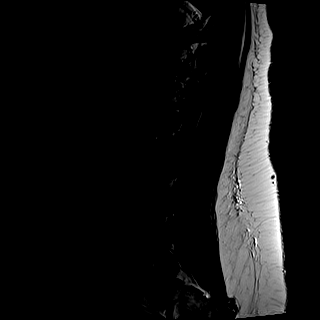
[im 17/17]
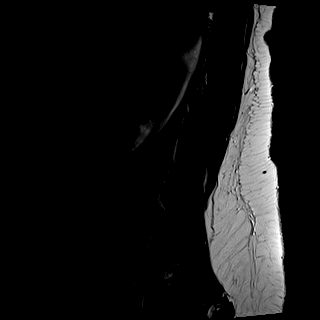

[Series 2: T1 · sagittal · 4.0mm · 0.81mm/px · 7 of 17 slices shown (1 of 2)]
[im 1/17]
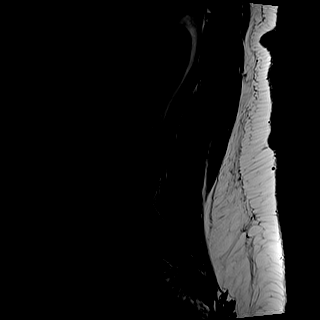
[im 3/17]
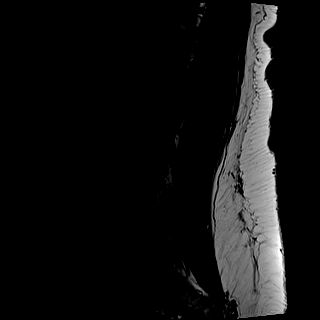
[im 6/17]
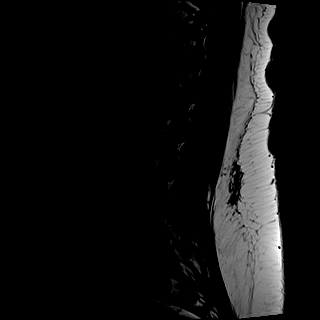
[im 9/17]
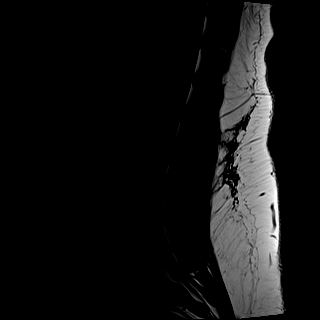
[im 11/17]
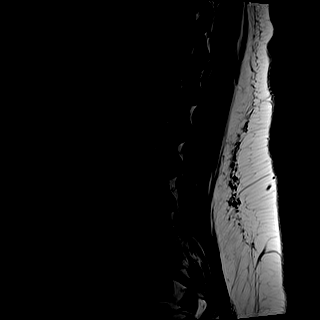
[im 14/17]
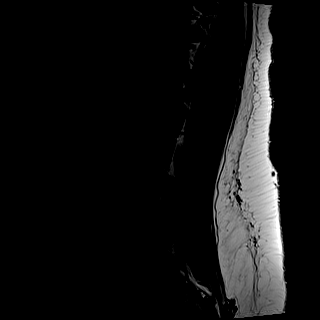
[im 17/17]
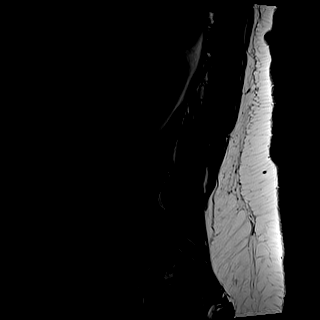

[Series 3: STIR · sagittal · 4.0mm · 0.41mm/px · 2 of 17 slices shown]
[im 1/17]
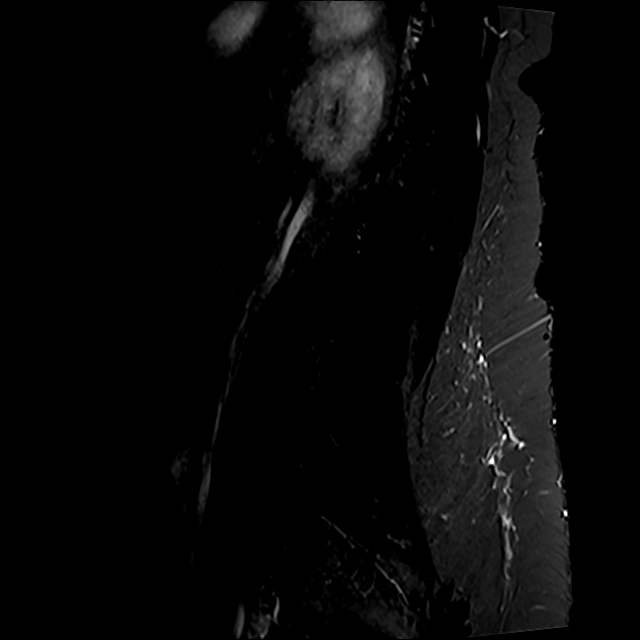
[im 3/17]
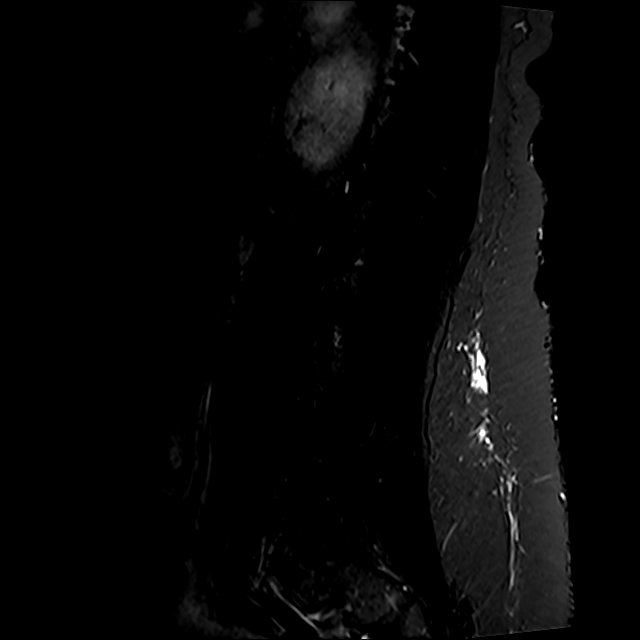

[Series 4: T2 · axial · 4.0mm · 0.78mm/px · z∈[-545,-333]mm · 8 of 34 slices shown (2 of 2)]
[im 1/34]
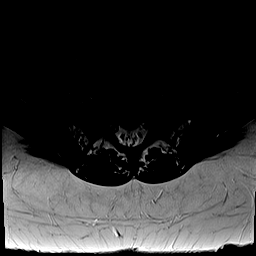
[im 6/34]
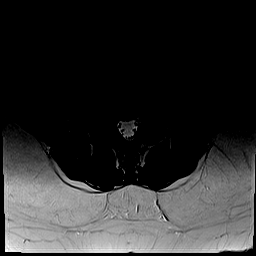
[im 11/34]
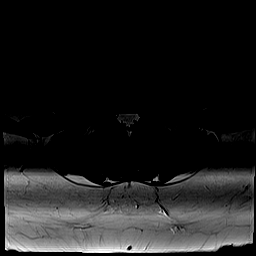
[im 16/34]
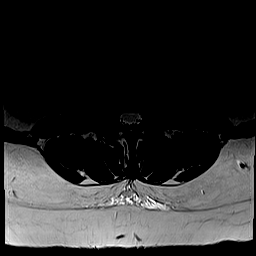
[im 18/34]
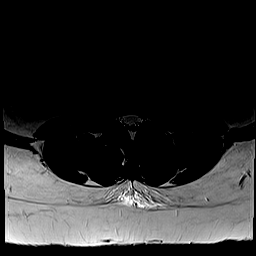
[im 23/34]
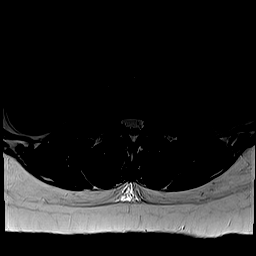
[im 28/34]
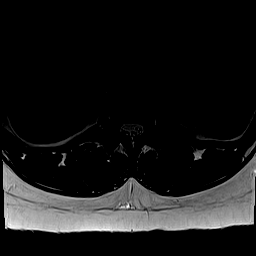
[im 34/34]
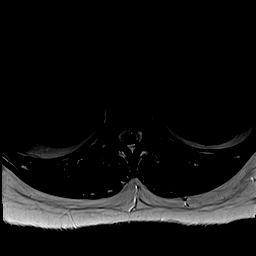

[Series 5: T1 · axial · 4.0mm · 0.39mm/px · z∈[-545,-333]mm · 8 of 34 slices shown (2 of 2)]
[im 1/34]
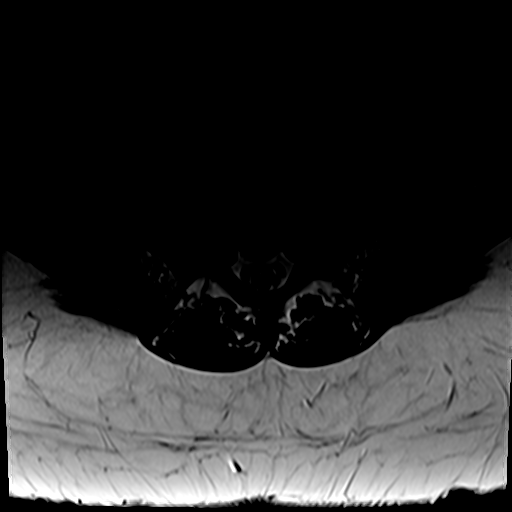
[im 6/34]
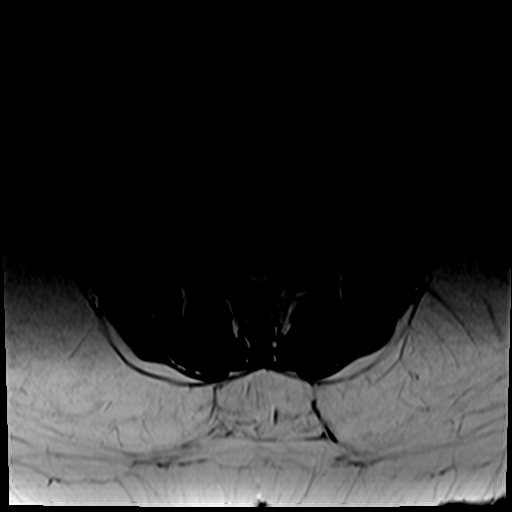
[im 11/34]
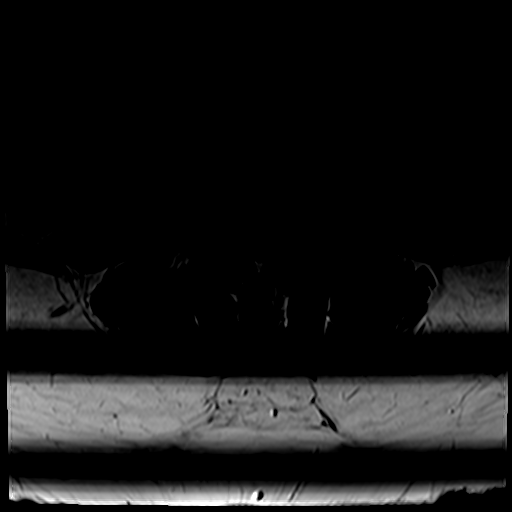
[im 16/34]
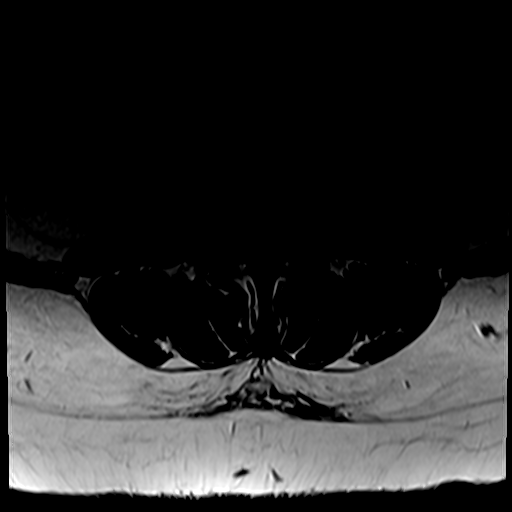
[im 18/34]
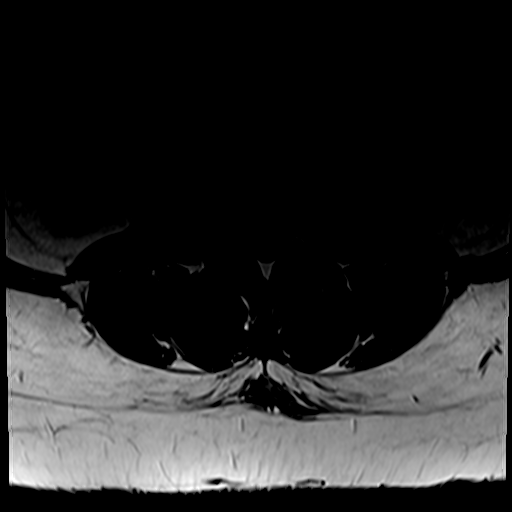
[im 23/34]
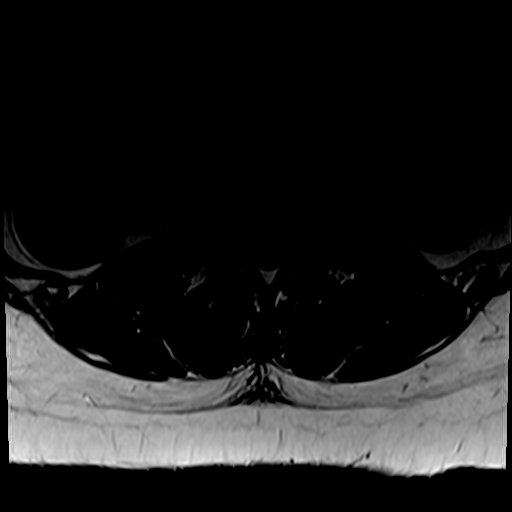
[im 28/34]
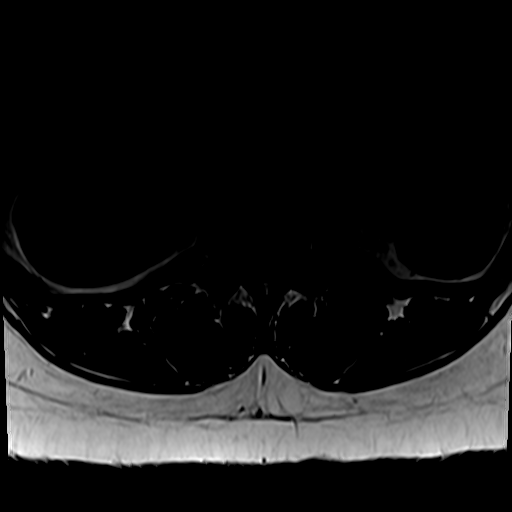
[im 34/34]
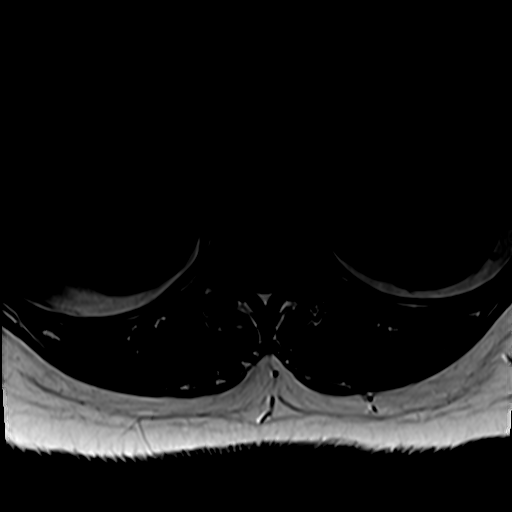

[31 of 48 positions shown; findings below may reference images not displayed]

FINDINGS: Segmentation:  Standard.

Alignment:  No substantial sagittal subluxation.

Lumbar vertebrae: Vertebral body heights are maintained. No specific
evidence of acute fracture or discitis/osteomyelitis. Mildly T1
hyperintense T12 and L4 vertebral body lesions, most consistent with
benign vertebral venous malformations. No suspicious bone lesions.

Partially imaged sacrum: There is suggestion of marrow edema in the
partially imaged lower sacrum, only seen on sagittal sequences
(series [DATE], images [DATE]).

Conus medullaris and cauda equina: Conus extends to the T12-L1
level. Conus appears normal.

Paraspinal and other soft tissues: Unremarkable.

Disc levels:

T12-L1: No significant disc protrusion, foraminal stenosis, or canal
stenosis.

L1-L2: No significant disc protrusion, foraminal stenosis, or canal
stenosis.

L2-L3: No significant disc protrusion, foraminal stenosis, or canal
stenosis.

L3-L4: No significant disc protrusion, foraminal stenosis, or canal
stenosis.

L4-L5: Slight disc bulging and mild facet arthropathy without
significant canal or foraminal stenosis.

L5-S1: Slight disc bulging and mild bilateral facet arthropathy
without significant canal or foraminal stenosis.
IMPRESSION: 1. Suggestion of bone marrow edema in the lower sacrum, which is
only imaged on the sagittal sequences and may represent a recent
fracture. Recommend pelvic MRI to further characterize and to
exclude other etiologies.
2. Mild lower lumbar degenerative change without significant
stenosis.

These results will be called to the ordering clinician or
representative by the Radiologist Assistant, and communication
documented in the PACS or [REDACTED].

## 2022-08-28 IMAGING — MR MR THORACIC SPINE W/O CM
6 series · 29 of 48 positions shown · non-contrast
Comparison: None.

CLINICAL DATA: Muscle spasm of back.  Chronic thoracic back pain.

EXAM:
MRI THORACIC SPINE WITHOUT CONTRAST
TECHNIQUE: Multiplanar, multisequence MR imaging of the thoracic spine was
performed. No intravenous contrast was administered.

[Series 18: T1 · sagittal · 6.0mm · 1.41mm/px · 2 of 9 slices shown (1 of 2)]
[im 1/9]
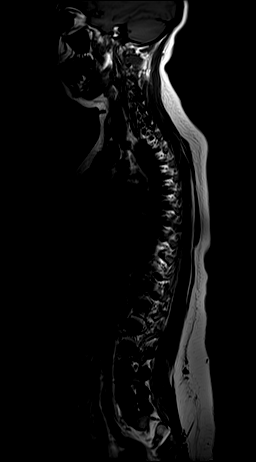
[im 9/9]
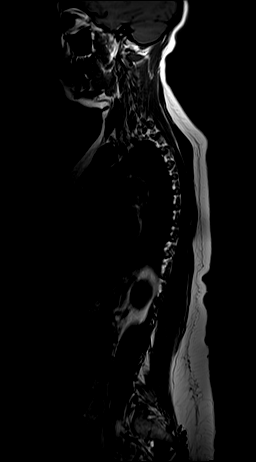

[Series 19: T2 · sagittal · 3.0mm · 1.06mm/px · 6 of 17 slices shown (1 of 2)]
[im 1/17]
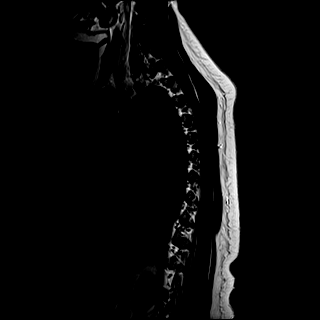
[im 4/17]
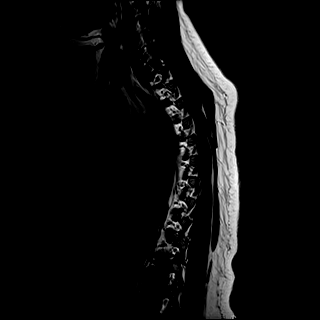
[im 7/17]
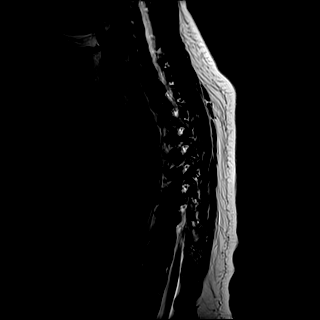
[im 10/17]
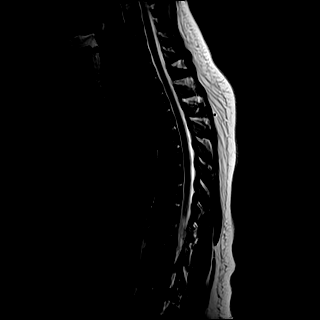
[im 13/17]
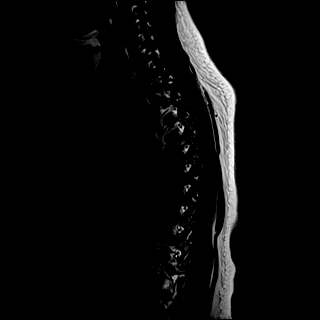
[im 17/17]
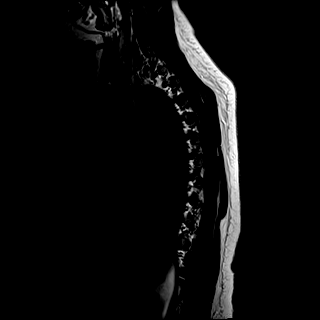

[Series 20: T1 · sagittal · 3.0mm · 1.06mm/px · 6 of 17 slices shown (2 of 2)]
[im 1/17]
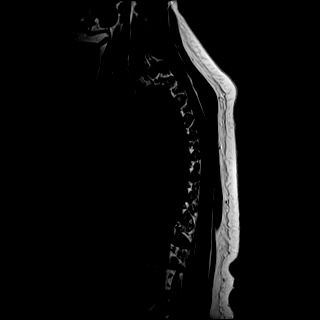
[im 4/17]
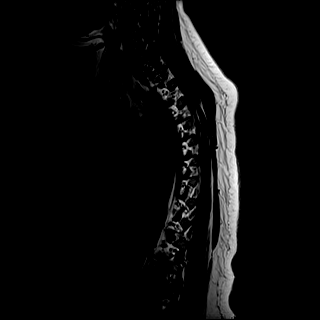
[im 7/17]
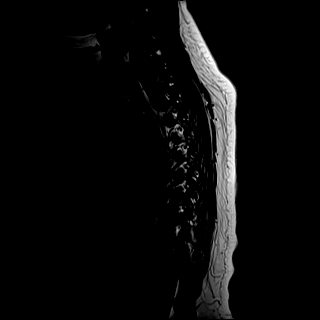
[im 10/17]
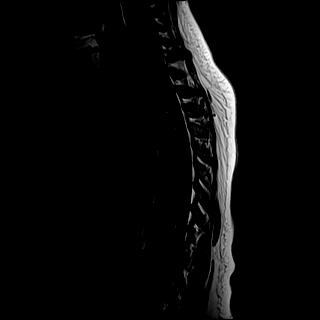
[im 13/17]
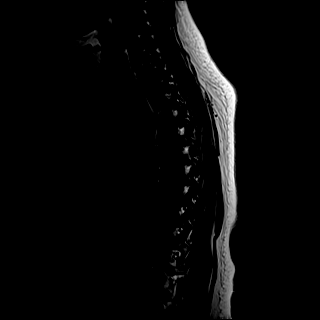
[im 17/17]
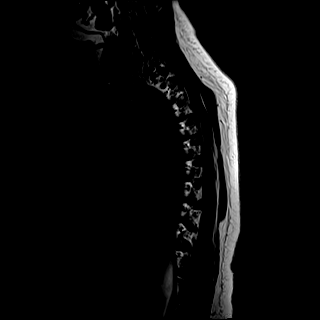

[Series 21: STIR · sagittal · 3.0mm · 0.53mm/px · 6 of 17 slices shown]
[im 1/17]
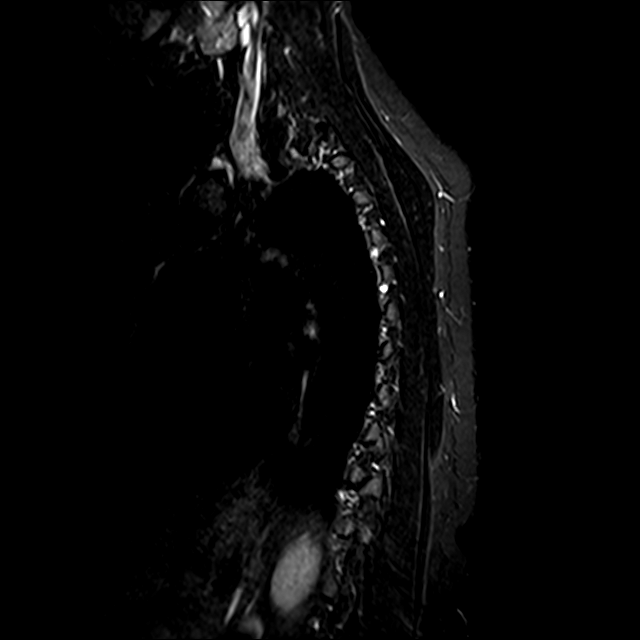
[im 4/17]
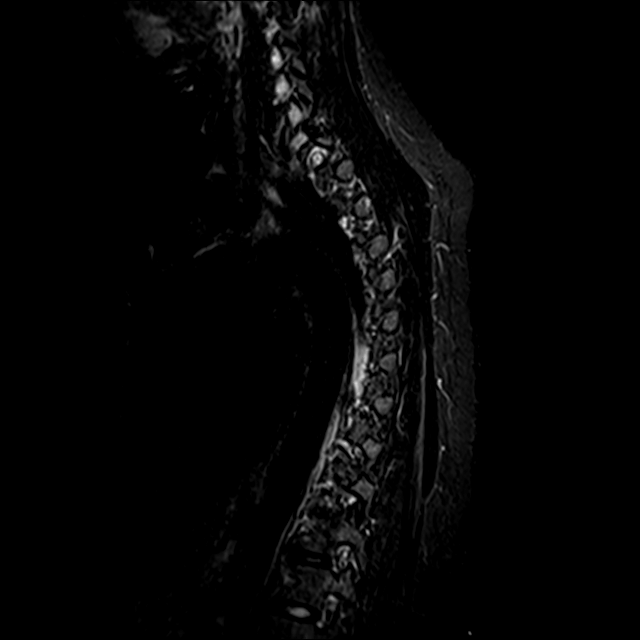
[im 7/17]
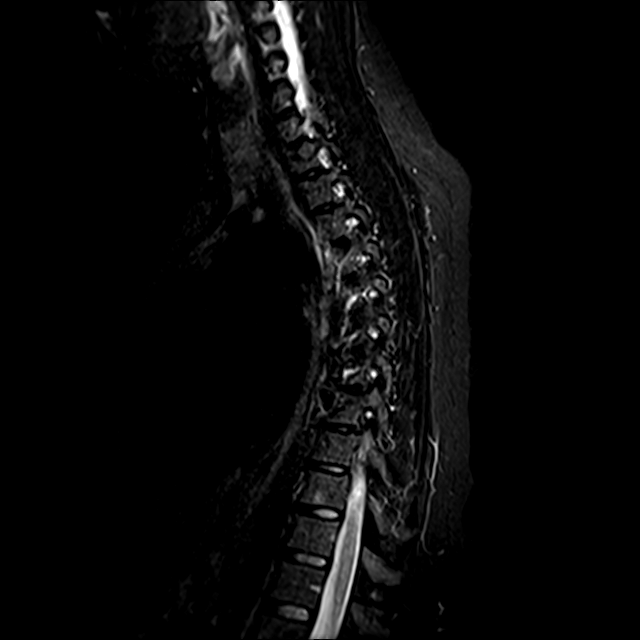
[im 10/17]
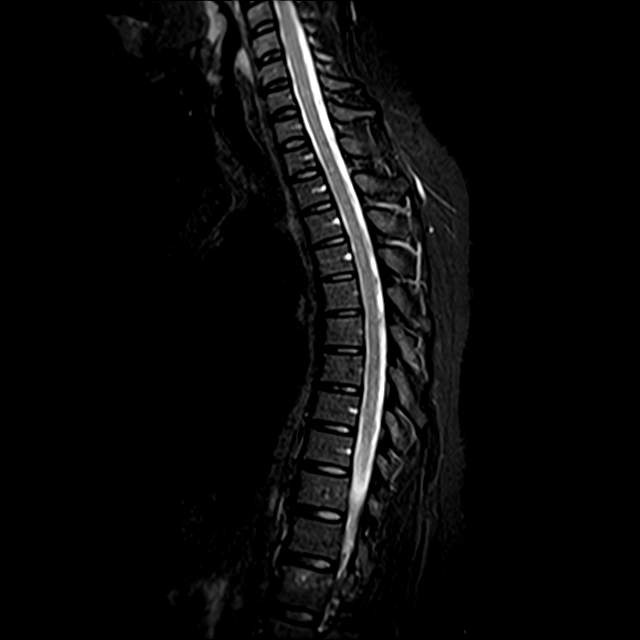
[im 13/17]
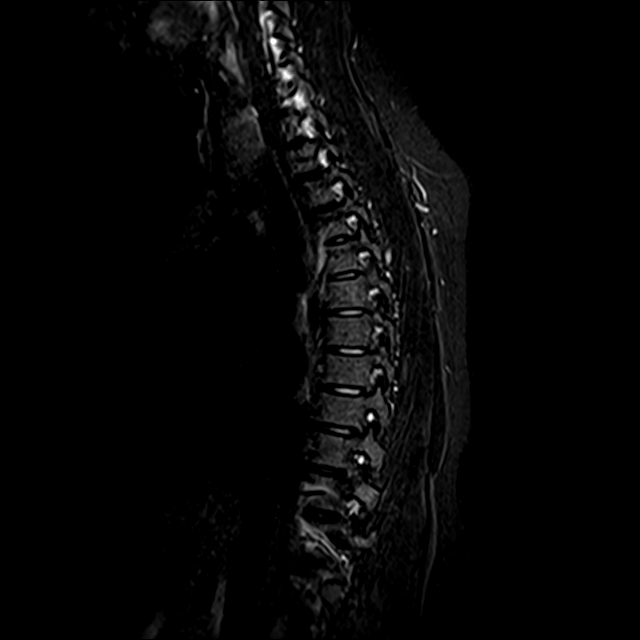
[im 17/17]
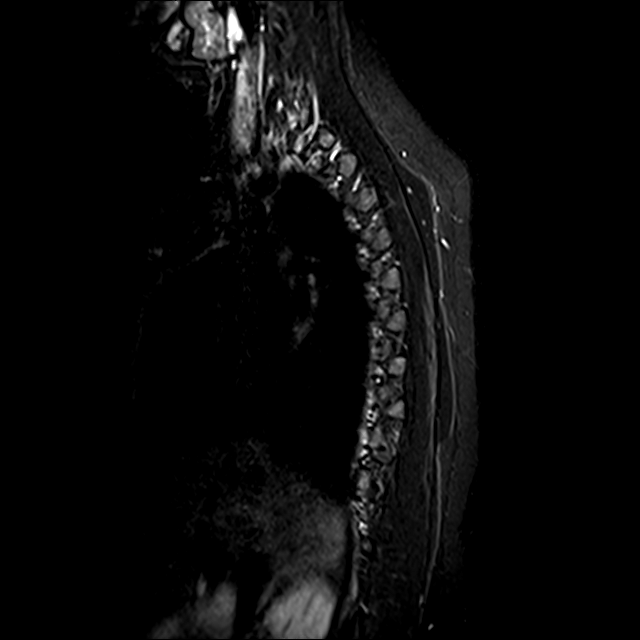

[Series 22: T2 · axial · 4.0mm · 0.59mm/px · z∈[-349,-130]mm · 8 of 39 slices shown (2 of 2)]
[im 1/39]
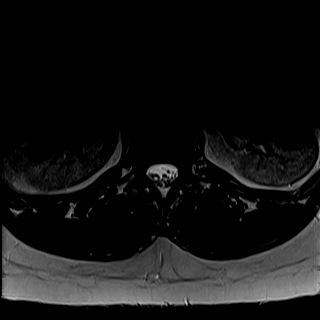
[im 6/39]
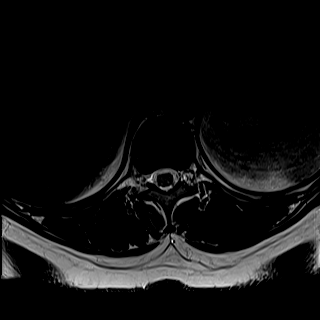
[im 12/39]
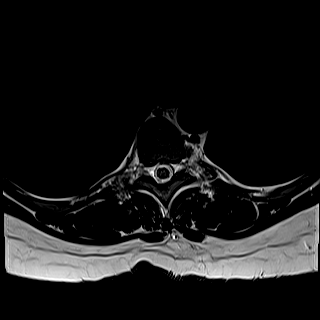
[im 18/39]
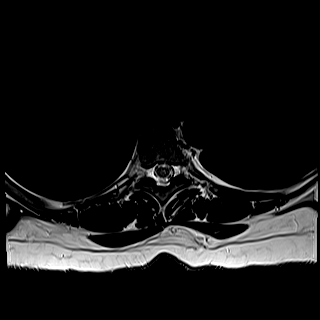
[im 21/39]
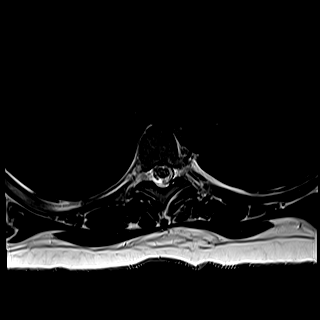
[im 27/39]
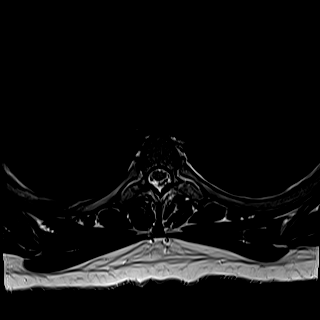
[im 33/39]
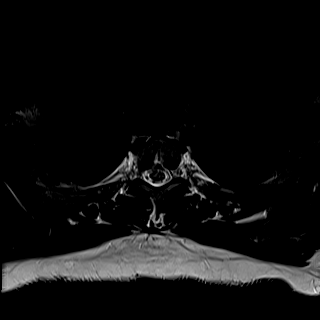
[im 39/39]
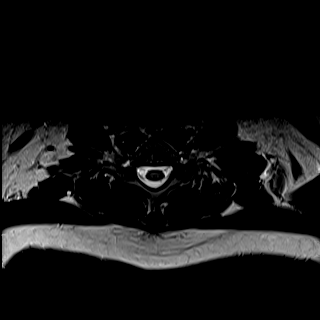

[Series 23: GRE · axial · 4.0mm · 0.37mm/px · 1 of 39 slices shown]
[im 1/39]
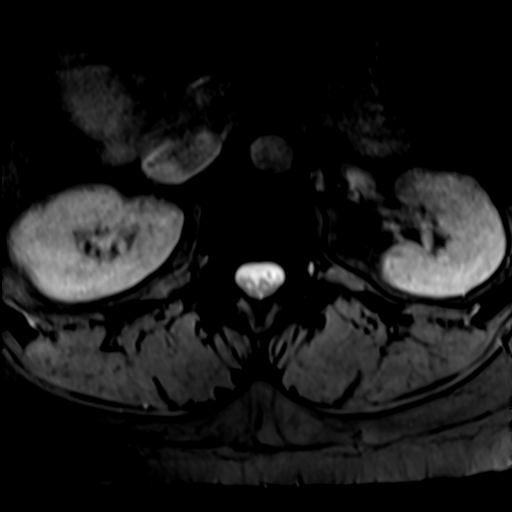

[29 of 48 positions shown; findings below may reference images not displayed]

FINDINGS: Alignment:  Normal.  No substantial sagittal subluxation.

Vertebrae: Vertebral body heights are maintained. No specific
evidence of acute fracture or discitis/osteomyelitis. Mildly T1
hyperintense T12 vertebral body lesion is most consistent with a
benign vertebral venous malformation. No suspicious bone lesions.

Cord:  Normal cord signal and morphology.

Paraspinal and other soft tissues: Unremarkable. No visible
paraspinal soft tissue edema.

Disc levels:

No significant disc protrusion, foraminal stenosis, or canal
stenosis. Mild multilevel facet arthropathy.
IMPRESSION: No significant disc protrusion, foraminal stenosis, or canal
stenosis.

## 2022-08-29 ENCOUNTER — Ambulatory Visit (LOCAL_COMMUNITY_HEALTH_CENTER): Payer: Medicaid Other

## 2022-08-29 VITALS — BP 140/88 | Ht 62.0 in | Wt 240.0 lb

## 2022-08-29 DIAGNOSIS — Z309 Encounter for contraceptive management, unspecified: Secondary | ICD-10-CM

## 2022-08-29 DIAGNOSIS — Z3042 Encounter for surveillance of injectable contraceptive: Secondary | ICD-10-CM

## 2022-08-29 DIAGNOSIS — Z308 Encounter for other contraceptive management: Secondary | ICD-10-CM

## 2022-08-29 DIAGNOSIS — Z3009 Encounter for other general counseling and advice on contraception: Secondary | ICD-10-CM

## 2022-08-29 NOTE — Progress Notes (Signed)
12 weeks 0 days post depo. Voices no concerns. Depo given today per order by Collene Leyden, FNP dated 12/07/2021. Tolerated well RUOQ. Next depo due 11/14/2022, pt aware.Josie Saunders, RN

## 2023-02-10 ENCOUNTER — Ambulatory Visit: Payer: Medicaid Other | Admitting: Family Medicine

## 2023-02-10 VITALS — BP 137/92 | HR 90 | Ht 62.0 in | Wt 247.2 lb

## 2023-02-10 DIAGNOSIS — N644 Mastodynia: Secondary | ICD-10-CM

## 2023-02-10 DIAGNOSIS — Z3202 Encounter for pregnancy test, result negative: Secondary | ICD-10-CM

## 2023-02-10 DIAGNOSIS — Z3009 Encounter for other general counseling and advice on contraception: Secondary | ICD-10-CM

## 2023-02-10 DIAGNOSIS — Z309 Encounter for contraceptive management, unspecified: Secondary | ICD-10-CM

## 2023-02-10 DIAGNOSIS — Z113 Encounter for screening for infections with a predominantly sexual mode of transmission: Secondary | ICD-10-CM

## 2023-02-10 DIAGNOSIS — Z01419 Encounter for gynecological examination (general) (routine) without abnormal findings: Secondary | ICD-10-CM | POA: Diagnosis not present

## 2023-02-10 LAB — WET PREP FOR TRICH, YEAST, CLUE
Trichomonas Exam: NEGATIVE
Yeast Exam: NEGATIVE

## 2023-02-10 LAB — HM HIV SCREENING LAB: HM HIV Screening: NEGATIVE

## 2023-02-10 LAB — HEMOGLOBIN, FINGERSTICK: Hemoglobin: 12.3 g/dL (ref 11.1–15.9)

## 2023-02-10 LAB — PREGNANCY, URINE: Preg Test, Ur: NEGATIVE

## 2023-02-10 MED ORDER — CLOTRIMAZOLE 1 % VA CREA: 1.0000 | TOPICAL_CREAM | Freq: Every day | VAGINAL | Status: DC

## 2023-02-10 MED ORDER — NORETHINDRONE 0.35 MG PO TABS
1.0000 | ORAL_TABLET | Freq: Every day | ORAL | 12 refills | Status: AC
Start: 2023-02-10 — End: ?

## 2023-02-10 NOTE — Progress Notes (Signed)
Pt is here for family planning visit.  Family planning packet reviewed and given to pt.  Wet prep results reviewed, no treatment required per standing orders. Condoms given. Gaspar Garbe, RN

## 2023-02-10 NOTE — Progress Notes (Signed)
[facility] DEPARTMENT [facility] [address] Main Number: [phone]  Family Planning Visit- Repeat Yearly Visit  Subjective:  Tina Young is a 32 y.o. G1P1001  being seen today for an annual wellness visit and to discuss contraception options.   The patient is currently using Withdrawal or Other Method for pregnancy prevention. Patient does not want a pregnancy in the next year.    report they are looking for a method that provides High efficacy at preventing pregnancy   Patient has the following medical problems: has Hernia of anterior abdominal wall; Obesity, unspecified; Hypertension; and Anxiety on their problem list.  Chief Complaint  Patient presents with   Annual Exam    Depo    Patient reports to clinic for PE and depo. States she is thinking about getting pregnant at some point. Discussed the return to fertility can be delayed with depo- opts to stop depo and start micronor for now.   See flowsheet for other program required questions.   Body mass index is 45.21 kg/m. - Patient is eligible for diabetes screening based on BMI> 25 and age >35?  no HA1C ordered? not applicable  Patient reports 4 of partners in last year. Desires STI screening?  Yes   Has patient been screened once for HCV in the past?  No  No results found for: "HCVAB"  Does the patient have current of drug use, have a partner with drug use, and/or has been incarcerated since last result? No  If yes-- Screen for HCV through [facility]   Does the patient meet criteria for HBV testing? No  Criteria:  -Household, sexual or needle sharing contact with HBV -History of drug use -HIV positive -Those with known Hep C   Health Maintenance Due  Topic Date Due   COVID-19 Vaccine (1 - 2023-24 season) Never done   DTaP/Tdap/Td (2 - Td or Tdap) 03/28/2022   PAP SMEAR-Modifier  07/30/2022    Review of Systems  Constitutional:  Negative for weight loss.   Eyes:  Negative for blurred vision.  Respiratory:  Negative for cough and shortness of breath.   Cardiovascular:  Negative for claudication.  Gastrointestinal:  Positive for nausea.  Genitourinary:  Negative for dysuria and frequency.  Skin:  Negative for rash.  Neurological:  Positive for dizziness. Negative for headaches.  Endo/Heme/Allergies:  Does not bruise/bleed easily.    The following portions of the patient's history were reviewed and updated as appropriate: allergies, current medications, past family history, past medical history, past social history, past surgical history and problem list. Problem list updated.  Objective:   Vitals:   02/10/23 1416  BP: (!) 137/92  Pulse: 90  Weight: 247 lb 3.2 oz (112.1 kg)  Height: 5\' 2"  (1.575 m)    Physical Exam Vitals and nursing note reviewed.  Constitutional:      Appearance: She is obese.  HENT:     Head: Normocephalic and atraumatic.     Mouth/Throat:     Mouth: Mucous membranes are moist.     Pharynx: Oropharynx is clear. No oropharyngeal exudate or posterior oropharyngeal erythema.  Pulmonary:     Effort: Pulmonary effort is normal.  Chest:  Breasts:    Tanner Score is 5.     Right: Normal. No mass, nipple discharge, skin change or tenderness.     Left: Normal. No mass, nipple discharge, skin change or tenderness.  Abdominal:     General: Abdomen is flat.  Palpations: There is no mass.     Tenderness: There is no abdominal tenderness. There is no rebound.  Genitourinary:    General: Normal vulva.     Exam position: Lithotomy position.     Pubic Area: No rash or pubic lice.      Labia:        Right: No rash or lesion.        Left: No rash or lesion.      Vagina: Normal. No vaginal discharge, erythema, bleeding or lesions.     Cervix: No cervical motion tenderness, discharge, friability, lesion or erythema.     Uterus: Normal.      Adnexa: Right adnexa normal and left adnexa normal.     Rectum: Normal.      Comments: pH = 4  Mild amt of white discharge present Lymphadenopathy:     Head:     Right side of head: No preauricular or posterior auricular adenopathy.     Left side of head: No preauricular or posterior auricular adenopathy.     Cervical: No cervical adenopathy.     Upper Body:     Right upper body: No supraclavicular, axillary or epitrochlear adenopathy.     Left upper body: No supraclavicular, axillary or epitrochlear adenopathy.     Lower Body: No right inguinal adenopathy. No left inguinal adenopathy.  Skin:    General: Skin is warm and dry.     Findings: No rash.  Neurological:     Mental Status: She is alert and oriented to person, place, and time.       Assessment and Plan:  Tina Young is a 32 y.o. female G1P1001 presenting to the [facility] Department for an yearly wellness and contraception visit  1. Well woman exam with routine gynecological exam -reports dizziness- discussed hydration, has a hx of anemia- Hgb check today -has a hx of sciatica and swollen joints which she reports she has had for years   - IGP, Aptima HPV - WET PREP FOR TRICH, YEAST, CLUE - Hemoglobin, fingerstick  2. Family planning Contraception counseling: Reviewed options based on patient desire and reproductive life plan. Patient is interested in Oral Contraceptive. This was provided to the patient today.   Risks, benefits, and typical effectiveness rates were reviewed.  Questions were answered.  Written information was also given to the patient to review.    The patient will follow up in  1 years for surveillance.  The patient was told to call with any further questions, or with any concerns about this method of contraception.  Emphasized use of condoms 100% of the time for STI prevention.  Educated on ECP and assessed need for ECP. Not indicated- Last sex 01/11/23, PT negative today  - norethindrone (ORTHO MICRONOR) 0.35 MG tablet; Take 1 tablet (0.35 mg total) by mouth  daily.  Dispense: 28 tablet; Refill: 12  3. Screening for venereal disease  - Pregnancy, urine - Chlamydia/Gonorrhea [place] - HIV [place] [facility] - Syphilis Serology, [place] [facility]  4. Breast pain -reported same tenderness 1 year ago -states this is more of a tenderness specifically near the nipples, reports this has been going on x 1 month- and believes it is due to depo being due -reports nausea which she also attributes to needing the depo shot    Return in about 1 year (around 02/10/2024) for annual well-woman exam.  No future appointments.  Lenice Llamas, [place]

## 2023-09-19 ENCOUNTER — Ambulatory Visit (LOCAL_COMMUNITY_HEALTH_CENTER)

## 2023-09-19 VITALS — Ht 61.0 in | Wt 244.5 lb

## 2023-09-19 DIAGNOSIS — Z3201 Encounter for pregnancy test, result positive: Secondary | ICD-10-CM

## 2023-09-19 DIAGNOSIS — Z309 Encounter for contraceptive management, unspecified: Secondary | ICD-10-CM | POA: Diagnosis not present

## 2023-09-19 LAB — PREGNANCY, URINE: Preg Test, Ur: POSITIVE — AB

## 2023-09-19 MED ORDER — PRENATAL 27-0.8 MG PO TABS
1.0000 | ORAL_TABLET | Freq: Every day | ORAL | Status: AC
Start: 2023-09-19 — End: 2023-12-28

## 2023-09-19 NOTE — Progress Notes (Signed)
 UPT positive. Plans to receive care at [facility]; advised to call clinic ASAP to establish prenatal care. Patient does take prescribed Sertraline and Losartan; consulted with Aliene Altes, FNP who reports it is okay to continue taking the Sertraline but Losartan is contraindicated in pregnancy (stop immediately). Advised patient to let [facility] know this so she can be put on different HBP medication. Patient verbalizes understanding.   The patient was dispensed prenatal vitamins #100 today. I provided counseling today regarding the medication. We discussed the medication, the side effects and when to call clinic.   Positive pregnancy packet reviewed and given to patient. Also counseled on hydration and when to seek medical attention.  Patient given the opportunity to ask questions. Questions answered.   Abagail Kitchens, RN

## 2023-09-20 ENCOUNTER — Encounter: Payer: Self-pay | Admitting: *Deleted

## 2023-09-20 ENCOUNTER — Emergency Department

## 2023-09-20 ENCOUNTER — Telehealth: Payer: Self-pay

## 2023-09-20 ENCOUNTER — Emergency Department
Admission: EM | Admit: 2023-09-20 | Discharge: 2023-09-20 | Disposition: A | Discharge status: Home / Self Care | Attending: Emergency Medicine | Admitting: Emergency Medicine

## 2023-09-20 ENCOUNTER — Other Ambulatory Visit: Payer: Self-pay

## 2023-09-20 DIAGNOSIS — R8271 Bacteriuria: Secondary | ICD-10-CM | POA: Diagnosis not present

## 2023-09-20 DIAGNOSIS — Z3A09 9 weeks gestation of pregnancy: Secondary | ICD-10-CM | POA: Diagnosis not present

## 2023-09-20 DIAGNOSIS — O4690 Antepartum hemorrhage, unspecified, unspecified trimester: Secondary | ICD-10-CM

## 2023-09-20 DIAGNOSIS — O3680X0 Pregnancy with inconclusive fetal viability, not applicable or unspecified: Secondary | ICD-10-CM

## 2023-09-20 DIAGNOSIS — O209 Hemorrhage in early pregnancy, unspecified: Secondary | ICD-10-CM | POA: Insufficient documentation

## 2023-09-20 DIAGNOSIS — O99891 Other specified diseases and conditions complicating pregnancy: Secondary | ICD-10-CM

## 2023-09-20 LAB — URINALYSIS, ROUTINE W REFLEX MICROSCOPIC
Bilirubin Urine: NEGATIVE
Glucose, UA: NEGATIVE mg/dL
Ketones, ur: 5 mg/dL — AB
Leukocytes,Ua: NEGATIVE
Nitrite: NEGATIVE
Protein, ur: 100 mg/dL — AB
RBC / HPF: >50 RBC/hpf (ref 0–5)
Specific Gravity, Urine: 1.028 (ref 1.005–1.030)
pH: 5 (ref 5.0–8.0)

## 2023-09-20 LAB — COMPREHENSIVE METABOLIC PANEL
ALT: 14 U/L (ref 0–44)
AST: 15 U/L (ref 15–41)
Albumin: 3.9 g/dL (ref 3.5–5.0)
Alkaline Phosphatase: 116 U/L (ref 38–126)
Anion gap: 8 (ref 5–15)
BUN: 7 mg/dL (ref 6–20)
CO2: 23 mmol/L (ref 22–32)
Calcium: 9 mg/dL (ref 8.9–10.3)
Chloride: 106 mmol/L (ref 98–111)
Creatinine, Ser: 0.65 mg/dL (ref 0.44–1.00)
GFR, Estimated: >60 mL/min (ref >60)
Glucose, Bld: 95 mg/dL (ref 70–99)
Potassium: 3.7 mmol/L (ref 3.5–5.1)
Sodium: 137 mmol/L (ref 135–145)
Total Bilirubin: 0.4 mg/dL (ref 0.0–1.2)
Total Protein: 7.8 g/dL (ref 6.5–8.1)

## 2023-09-20 LAB — CBC
HCT: 35.7 % — ABNORMAL LOW (ref 36.0–46.0)
Hemoglobin: 11.5 g/dL — ABNORMAL LOW (ref 12.0–15.0)
MCH: 28.3 pg (ref 26.0–34.0)
MCHC: 32.2 g/dL (ref 30.0–36.0)
MCV: 87.7 fL (ref 80.0–100.0)
Platelets: 305 10*3/uL (ref 150–400)
RBC: 4.07 MIL/uL (ref 3.87–5.11)
RDW: 13.1 % (ref 11.5–15.5)
WBC: 9.1 10*3/uL (ref 4.0–10.5)
nRBC: 0 % (ref 0.0–0.2)

## 2023-09-20 LAB — LIPASE, BLOOD: Lipase: 37 U/L (ref 11–51)

## 2023-09-20 LAB — ABO/RH: ABO/RH(D): B POS

## 2023-09-20 LAB — HCG, QUANTITATIVE, PREGNANCY: hCG, Beta Chain, Quant, S: 1478 m[IU]/mL — ABNORMAL HIGH (ref <5)

## 2023-09-20 MED ORDER — ONDANSETRON 4 MG PO TBDP
4.0000 mg | ORAL_TABLET | Freq: Once | ORAL | Status: AC
  Administered 2023-09-20: 4 mg via ORAL
  Filled 2023-09-20: qty 1

## 2023-09-20 MED ORDER — CEPHALEXIN 500 MG PO CAPS: 500.0000 mg | ORAL_CAPSULE | Freq: Three times a day (TID) | ORAL | 0 refills | Status: AC

## 2023-09-20 MED ORDER — ONDANSETRON 4 MG PO TBDP: 4.0000 mg | ORAL_TABLET | Freq: Three times a day (TID) | ORAL | 0 refills | Status: AC | PRN

## 2023-09-20 MED ORDER — CEPHALEXIN 500 MG PO CAPS
500.0000 mg | ORAL_CAPSULE | Freq: Once | ORAL | Status: AC
  Administered 2023-09-20: 500 mg via ORAL
  Filled 2023-09-20: qty 1

## 2023-09-20 NOTE — ED Triage Notes (Signed)
 Pt to triage via wheelchair.  Pt is pregnant confirmed at the health dept yesterday per pt.  Pt reports vaginal bleeding today with abd pain.  Denies urinary sx. No vag discharge.  Pt alert.

## 2023-09-20 NOTE — Telephone Encounter (Signed)
 TRIAGE VOICEMAIL: Patient advised she is a new ob patient. She's a few weeks pregnant and noticed she is bleeding a little. Inquiring if she needs to be seen or go to ED.

## 2023-09-20 NOTE — Discharge Instructions (Addendum)
 Your blood and home pregnancy test are positive at this time.  Your ultrasound however does not show the location of this pregnancy at this time.  You should follow-up with your OB provider as discussed, for repeat blood test and repeat ultrasound.  Take prescription antibiotic as directed for your asymptomatic UTI.

## 2023-09-20 NOTE — Telephone Encounter (Signed)
 Left voicemail advising to report to ED for evaluation.

## 2023-09-20 NOTE — ED Notes (Signed)
 The pt is a 32 yof lying supine in the bed with her head slightly elevated. The pt is warm, pink, and dry. The pt is alert and oriented x 4. The pt advised she had recently found out she is pregnant. The pt advised she is unsure of how far along she is. The pt stated today she notice she was having vaginal bleeding and bilateral side pain. Call bell placed within reach of the pt.

## 2023-09-20 NOTE — ED Provider Notes (Signed)
 [facility] Emergency Department Provider Note     Event Date/Time   First MD Initiated Contact with Patient 09/20/23 1608     (approximate)   History   Vaginal Bleeding and Abdominal Pain   HPI  Tina Young is a 33 y.o. female G2P1, estimated to be 9 weeks by her LMP, presents to the ED for evaluation of vaginal bleeding.  Patient presented to the health partner yesterday and had her pregnancy confirmed with a UPT.  She presents today noting abdominal discomfort and some vaginal bleeding or denies any urinary symptoms to include urgency, frequency, dysuria.  She also denies any vaginal discharge.   Physical Exam   Triage Vital Signs: ED Triage Vitals  Encounter Vitals Group     BP 09/20/23 1556 (!) 160/91     Systolic BP Percentile --      Diastolic BP Percentile --      Pulse Rate 09/20/23 1556 63     Resp 09/20/23 1556 18     Temp 09/20/23 1556 98.3 F (36.8 C)     Temp Source 09/20/23 1556 Oral     SpO2 09/20/23 1556 96 %     Weight 09/20/23 1550 244 lb (110.7 kg)     Height 09/20/23 1550 5\' 2"  (1.575 m)     Head Circumference --      Peak Flow --      Pain Score 09/20/23 1550 6     Pain Loc --      Pain Education --      Exclude from Growth Chart --     Most recent vital signs: Vitals:   09/20/23 2014 09/20/23 2230  BP: (!) 152/87 (!) 149/83  Pulse: 68 72  Resp: 17 18  Temp: 98 F (36.7 C) 98 F (36.7 C)  SpO2: 96% 97%    General Awake, no distress. NAD HEENT NCAT. PERRL. EOMI. No rhinorrhea. Mucous membranes are moist.  CV:  Good peripheral perfusion.  RESP:  Normal effort.  ABD:  No distention.  GU:  deferred   ED Results / Procedures / Treatments   Labs (all labs ordered are listed, but only abnormal results are displayed) Labs Reviewed  CBC - Abnormal; Notable for the following components:      Result Value   Hemoglobin 11.5 (*)    HCT 35.7 (*)    All other components within normal limits  URINALYSIS,  ROUTINE W REFLEX MICROSCOPIC - Abnormal; Notable for the following components:   Color, Urine AMBER (*)    APPearance CLOUDY (*)    Hgb urine dipstick LARGE (*)    Ketones, ur 5 (*)    Protein, ur 100 (*)    Bacteria, UA FEW (*)    All other components within normal limits  HCG, QUANTITATIVE, PREGNANCY - Abnormal; Notable for the following components:   hCG, Beta Chain, Quant, S 1,478 (*)    All other components within normal limits  LIPASE, BLOOD  COMPREHENSIVE METABOLIC PANEL  ABO/RH    EKG   RADIOLOGY  I personally viewed and evaluated these images as part of my medical decision making, as well as reviewing the written report by the radiologist.  ED Provider Interpretation: No evidence of IUP at this time, likely early pregnancy versus ectopic  US OB w/ Transvaginal <14 Weeks  IMPRESSION: Thickened, heterogeneous endometrium without evidence of and intrauterine pregnancy. Correlation with follow-up pelvic ultrasound and serial beta HCG levels is recommended.  PROCEDURES:  Critical Care performed: No  Procedures   MEDICATIONS ORDERED IN ED: Medications  ondansetron (ZOFRAN-ODT) disintegrating tablet 4 mg (4 mg Oral Given 09/20/23 1932)  cephALEXin (KEFLEX) capsule 500 mg (500 mg Oral Given 09/20/23 1932)     IMPRESSION / MDM / ASSESSMENT AND PLAN / ED COURSE  I reviewed the triage vital signs and the nursing notes.                              Differential diagnosis includes, but is not limited to, threatened miscarriage, incomplete miscarriage, normal bleeding from an early trimester pregnancy, ectopic pregnancy, , blighted ovum, vaginal/cervical trauma, subchorionic hemorrhage/hematoma, etc.  Patient's presentation is most consistent with acute complicated illness / injury requiring diagnostic workup.  Patient's diagnosis is consistent with asymptomatic bacteriuria and positive pregnancy test. Ultrasound does not confirm an IUP at this time.  Etiology includes  an early IUP, ectopic pregnancy, or otherwise pregnancy of unclear location.  Normal patient will be discharged home with prescriptions for Zofran and Keflex. Patient is to follow up with her OB provider as discussed, for serial beta quant and repeat ultrasound, as needed or otherwise directed. Patient is given ED precautions to return to the ED for any worsening or new symptoms.   FINAL CLINICAL IMPRESSION(S) / ED DIAGNOSES   Final diagnoses:  Vaginal bleeding in pregnancy  Asymptomatic bacteriuria in pregnancy  Pregnancy of unknown anatomic location     Rx / DC Orders   ED Discharge Orders          Ordered    cephALEXin (KEFLEX) 500 MG capsule  3 times daily        09/20/23 2206    ondansetron (ZOFRAN-ODT) 4 MG disintegrating tablet  Every 8 hours PRN        09/20/23 2206             Note:  This document was prepared using Dragon voice recognition software and may include unintentional dictation errors.    Lissa Hoard, PA-C 09/23/23 2002    Merwyn Katos, MD 09/26/23 [phone]

## 2023-09-20 NOTE — ED Triage Notes (Signed)
 First nurse note: pt to ED For vaginal bleeding. Reports LMP December. Thinks she is pregnant. Took plan b in December.

## 2023-09-21 ENCOUNTER — Other Ambulatory Visit: Payer: Self-pay

## 2023-09-21 DIAGNOSIS — O209 Hemorrhage in early pregnancy, unspecified: Secondary | ICD-10-CM

## 2023-09-22 ENCOUNTER — Other Ambulatory Visit

## 2023-09-23 LAB — BETA HCG QUANT (REF LAB): hCG Quant: 174 m[IU]/mL

## 2023-09-24 ENCOUNTER — Telehealth: Payer: Self-pay | Admitting: Obstetrics

## 2023-09-24 NOTE — Telephone Encounter (Signed)
 Called Tina Young to discuss dropping hcg level and SAB in progress. She has had some bleeding and clots but no heavy bleeding/cramping. Discussed what to expect, danger signs, and when to go to the hospital. Offered condolences.  Has [place] scheduled 09/28/23. Will follow quants.   Glenetta Borg, CNM

## 2023-09-25 ENCOUNTER — Encounter: Payer: Self-pay | Admitting: Certified Nurse Midwife

## 2023-09-27 ENCOUNTER — Other Ambulatory Visit: Payer: Self-pay

## 2023-09-27 DIAGNOSIS — O039 Complete or unspecified spontaneous abortion without complication: Secondary | ICD-10-CM

## 2023-09-28 ENCOUNTER — Ambulatory Visit
Admission: RE | Admit: 2023-09-28 | Discharge: 2023-09-28 | Disposition: A | Discharge status: Home / Self Care | Source: Ambulatory Visit | Attending: Obstetrics | Admitting: Obstetrics

## 2023-09-28 DIAGNOSIS — O209 Hemorrhage in early pregnancy, unspecified: Secondary | ICD-10-CM | POA: Insufficient documentation

## 2023-09-29 ENCOUNTER — Ambulatory Visit: Admitting: Certified Nurse Midwife

## 2023-09-29 ENCOUNTER — Encounter: Payer: Self-pay | Admitting: Certified Nurse Midwife

## 2023-09-29 ENCOUNTER — Other Ambulatory Visit

## 2023-09-29 VITALS — BP 134/84 | HR 103 | Ht 62.0 in | Wt 249.0 lb

## 2023-09-29 DIAGNOSIS — Z09 Encounter for follow-up examination after completed treatment for conditions other than malignant neoplasm: Secondary | ICD-10-CM | POA: Diagnosis not present

## 2023-09-29 DIAGNOSIS — O039 Complete or unspecified spontaneous abortion without complication: Secondary | ICD-10-CM

## 2023-09-29 MED ORDER — PHEXXI 1.8-1-0.4 % VA GEL: 1.0000 | VAGINAL | 11 refills | Status: AC | PRN

## 2023-09-29 NOTE — Progress Notes (Signed)
 Abram Sander, MD   Chief Complaint  Patient presents with   Miscarriage    Bleeding stopped yesterday    HPI:      Tina Young is a 33 y.o. G2P1011 whose LMP was Patient's last menstrual period was 07/15/2023 (approximate)., presents today for follow up after miscarriage. She stopped bleeding yesterday. Denies pain or cramping, signs of infection or odor to vaginal discharge. Reviewed pictures of clots on phone, reassured of normalcy. She has restarted her losartan. She is interested in a pregnancy in about 5-46m, desires to allow time for healing. Has used withdrawal for contraception.    Patient Active Problem List   Diagnosis Date Noted   Hypertension 07/31/2019   Anxiety 07/31/2019   Hernia of anterior abdominal wall 09/29/2015   Obesity, unspecified 08/26/2014    Past Surgical History:  Procedure Laterality Date   CESAREAN SECTION      Family History  Problem Relation Age of Onset   Asthma Mother    Hypertension Father    Hypertension Sister    Asthma Brother    Diabetes Brother    Hypertension Brother    Hypertension Maternal Grandmother    Healthy Paternal Grandmother    Asthma Son     Social History   Socioeconomic History   Marital status: Single    Spouse name: Not on file   Number of children: 1   Years of education: Not on file   Highest education level: Not on file  Occupational History   Not on file  Tobacco Use   Smoking status: Former    Current packs/day: 0.10    Types: Cigarettes   Smokeless tobacco: Never   Tobacco comments:    vaping some days  Vaping Use   Vaping status: Former   Substances: Nicotine, Flavoring  Substance and Sexual Activity   Alcohol use: Not Currently    Comment: socially   Drug use: Not Currently    Comment: last mj use 09/16/2023   Sexual activity: Yes    Partners: Male    Comment: last OCP 06/2023  Other Topics Concern   Not on file  Social History Narrative   Not on file   Social Drivers of  Health   Financial Resource Strain: High Risk (04/15/2021)   Received from [facility], [facility]   Overall Financial Resource Strain (CARDIA)    Difficulty of Paying Living Expenses: Very hard  Food Insecurity: Food Insecurity Present (04/15/2021)   Received from [facility], [facility]   Hunger Vital Sign    Worried About Running Out of Food in the Last Year: Sometimes true    Ran Out of Food in the Last Year: Sometimes true  Transportation Needs: No Transportation Needs (04/15/2021)   Received from [facility], [facility]   PRAPARE - Transportation    In the past 12 months, has lack of transportation kept you from medical appointments or from getting medications?: No    Lack of Transportation (Non-Medical): No  Physical Activity: Inactive (04/15/2021)   Received from [facility], [facility]   Exercise Vital Sign    Days of Exercise per Week: 0 days    Minutes of Exercise per Session: 0 min  Stress: Stress Concern Present (04/15/2021)   Received from [facility], [facility]   [employer] of Occupational Health - Occupational Stress Questionnaire  Feeling of Stress : Rather much  Social Connections: Moderately Isolated (04/15/2021)   Received from [facility], [facility]   Social Connection and Isolation Panel [NHANES]    Frequency of Communication with Friends and Family: More than three times a week    Frequency of Social Gatherings with Friends and Family: Three times a week    Attends Religious Services: More than 4 times per year    Active Member of Clubs or Organizations: No    Attends Banker Meetings: Never    Marital Status: Never married  Intimate Partner Violence: Not At Risk (09/19/2023)   Humiliation, Afraid, Rape, and Kick  questionnaire    Fear of Current or Ex-Partner: No    Emotionally Abused: No    Physically Abused: No    Sexually Abused: No    Outpatient Medications Prior to Visit  Medication Sig Dispense Refill   losartan (COZAAR) 25 MG tablet Take 25 mg by mouth daily.     sertraline (ZOLOFT) 50 MG tablet Take 50 mg by mouth daily.     norethindrone (ORTHO MICRONOR) 0.35 MG tablet Take 1 tablet (0.35 mg total) by mouth daily. (Patient not taking: Reported on 09/29/2023) 28 tablet 12   ondansetron (ZOFRAN-ODT) 4 MG disintegrating tablet Take 1 tablet (4 mg total) by mouth every 8 (eight) hours as needed for nausea or vomiting. (Patient not taking: Reported on 09/29/2023) 15 tablet 0   pregabalin (LYRICA) 150 MG capsule pregabalin 150 mg capsule  TAKE 1 CAPSULE BY MOUTH TWICE DAILY FOR BACK PAIN (Patient not taking: Reported on 09/29/2023)     Prenatal Vit-Fe Fumarate-FA (MULTIVITAMIN-PRENATAL) 27-0.8 MG TABS tablet Take 1 tablet by mouth daily at 12 noon. (Patient not taking: Reported on 09/29/2023)     No facility-administered medications prior to visit.      ROS:  Review of Systems  Constitutional: Negative.   Respiratory: Negative.    Genitourinary: Negative.      OBJECTIVE:   Vitals:  BP 134/84   Pulse (!) 103   Ht 5\' 2"  (1.575 m)   Wt 249 lb (112.9 kg)   LMP 07/15/2023 (Approximate)   Breastfeeding No   BMI 45.54 kg/m   Physical Exam Constitutional:      Appearance: Normal appearance.  Cardiovascular:     Rate and Rhythm: Normal rate.  Pulmonary:     Effort: Pulmonary effort is normal.  Neurological:     General: No focal deficit present.     Mental Status: She is alert and oriented to person, place, and time.  Psychiatric:        Mood and Affect: Mood normal.        Behavior: Behavior normal.     Results: No results found for this or any previous visit (from the past 24 hours).   Assessment/Plan: Follow-up visit after miscarriage  Miscarriage - Plan: Beta hCG  quant (ref lab)  Repeat Hcg today. Recommend continuing daily PNV, switch to labetalol, stop losartan prior to trying to conceive. Phexxi prescribed for use in addition to withdrawal to help allow timing of subsequent pregnancy.  No orders of the defined types were placed in this encounter.    Dominica Severin, CNM 09/29/2023 10:40 AM

## 2023-09-30 LAB — BETA HCG QUANT (REF LAB): hCG Quant: 9 m[IU]/mL

## 2023-10-02 ENCOUNTER — Other Ambulatory Visit: Payer: Self-pay | Admitting: Obstetrics

## 2023-10-02 DIAGNOSIS — O039 Complete or unspecified spontaneous abortion without complication: Secondary | ICD-10-CM

## 2023-10-02 NOTE — Progress Notes (Signed)
 Called Tina Young to discuss lab and [place] results. SAB appears complete. She stopped bleeding Friday. Will trend bhCG down to <5.   Glenetta Borg, CNM

## 2023-10-06 ENCOUNTER — Ambulatory Visit

## 2023-10-16 ENCOUNTER — Other Ambulatory Visit

## 2023-10-16 DIAGNOSIS — O039 Complete or unspecified spontaneous abortion without complication: Secondary | ICD-10-CM

## 2023-10-17 ENCOUNTER — Encounter: Payer: Self-pay | Admitting: Obstetrics

## 2023-10-17 LAB — HUMAN CHORIONIC GONADOTROPIN(HCG),B-SUBUNIT,QUANTITATIVE): HCG, Beta Chain, Quant, S: <1 m[IU]/mL

## 2023-10-20 ENCOUNTER — Other Ambulatory Visit

## 2023-11-03 ENCOUNTER — Encounter: Admitting: Obstetrics

## 2023-11-06 ENCOUNTER — Encounter: Admitting: Obstetrics

## 2024-01-17 ENCOUNTER — Ambulatory Visit: Admitting: Family Medicine

## 2024-01-17 ENCOUNTER — Encounter: Payer: Self-pay | Admitting: Family Medicine

## 2024-01-17 VITALS — BP 145/95 | HR 66 | Ht 62.0 in | Wt 250.0 lb

## 2024-01-17 DIAGNOSIS — Z113 Encounter for screening for infections with a predominantly sexual mode of transmission: Secondary | ICD-10-CM

## 2024-01-17 LAB — HM HIV SCREENING LAB: HM HIV Screening: NEGATIVE

## 2024-01-17 LAB — WET PREP FOR TRICH, YEAST, CLUE
Clue Cell Exam: NEGATIVE
Trichomonas Exam: NEGATIVE
Yeast Exam: NEGATIVE

## 2024-01-17 NOTE — Progress Notes (Signed)
 Pt is here for an acute visit. Wet prep results reviewed with patient and requires no treatment. Condoms declined. Wilkie Drought, RN.

## 2024-01-17 NOTE — Progress Notes (Signed)
 [facility] Problem Visit  [facility]  Subjective:  Tina Young is a 33 y.o. being seen today for   Chief Complaint  Patient presents with   Acute Visit    HPI  Reports she has been having vaginal irritation since Saturday after having sex without a condom. On the pill.    Health Maintenance Due  Topic Date Due   DTaP/Tdap/Td (2 - Td or Tdap) 03/28/2022   COVID-19 Vaccine (4 - 2024-25 season) 03/19/2023    ROS  The following portions of the patient's history were reviewed and updated as appropriate: allergies, current medications, past family history, past medical history, past social history, past surgical history and problem list. Problem list updated.   See flowsheet for other program required questions.  Objective:   Vitals:   01/17/24 1022  BP: (!) 145/95  Pulse: 66  Weight: 250 lb (113.4 kg)  Height: 5' 2 (1.575 m)    Physical Exam Vitals and nursing note reviewed. Exam conducted with a chaperone present Brett Orange CNA).  Constitutional:      Appearance: Normal appearance.  HENT:     Head: Normocephalic and atraumatic.     Mouth/Throat:     Mouth: Mucous membranes are moist.     Pharynx: Oropharynx is clear. No oropharyngeal exudate or posterior oropharyngeal erythema.  Pulmonary:     Effort: Pulmonary effort is normal.  Abdominal:     General: Abdomen is flat.     Palpations: There is no mass.     Tenderness: There is no abdominal tenderness. There is no rebound.  Genitourinary:    General: Normal vulva.     Exam position: Lithotomy position.     Pubic Area: No rash or pubic lice.      Labia:        Right: No rash or lesion.        Left: No rash or lesion.      Vagina: Vaginal discharge present. No erythema, bleeding or lesions.     Cervix: No cervical motion tenderness, discharge, friability, lesion or erythema.     Uterus: Normal.      Adnexa: Right adnexa normal and left adnexa normal.      Rectum: Normal.     Comments: pH = 5  Small amt of white discharge present  Lymphadenopathy:     Head:     Right side of head: No preauricular or posterior auricular adenopathy.     Left side of head: No preauricular or posterior auricular adenopathy.     Cervical: No cervical adenopathy.     Upper Body:     Right upper body: No supraclavicular, axillary or epitrochlear adenopathy.     Left upper body: No supraclavicular, axillary or epitrochlear adenopathy.     Lower Body: No right inguinal adenopathy. No left inguinal adenopathy.  Skin:    General: Skin is warm and dry.     Findings: No rash.  Neurological:     Mental Status: She is alert and oriented to person, place, and time.       Assessment and Plan:  Tina Young is a 33 y.o. female presenting to the [facility] Department for a Women's Health problem visit  1. Screening for venereal disease (Primary)  - Chlamydia/Gonorrhea [place] [facility] - HIV [place] [facility] - Syphilis Serology, [place] [facility] - WET PREP FOR TRICH, YEAST, CLUE     Return in about 4 weeks (around  02/14/2024) for annual well-woman exam.  No future appointments.  Verneta Bers, [place]

## 2024-01-30 ENCOUNTER — Telehealth: Payer: Self-pay

## 2024-01-30 NOTE — Telephone Encounter (Signed)
 Call pt re positive chlamydia result from 01/17/24 vaginal specimen. Needs tx.

## 2024-01-31 ENCOUNTER — Encounter: Payer: Self-pay | Admitting: Family Medicine

## 2024-01-31 NOTE — Telephone Encounter (Signed)
 Phone call to pt at [phone]. Pt answered and confirmed password. Counseled pt re + CT result and need for tx.  Tx appt scheduled for 02/01/24 AM per pt request.

## 2024-02-01 ENCOUNTER — Ambulatory Visit

## 2024-02-01 DIAGNOSIS — A749 Chlamydial infection, unspecified: Secondary | ICD-10-CM

## 2024-02-01 MED ORDER — DOXYCYCLINE HYCLATE 100 MG PO TABS: 100.0000 mg | ORAL_TABLET | Freq: Two times a day (BID) | ORAL | Status: AC

## 2024-02-01 NOTE — Progress Notes (Signed)
 In nurse clinic for Chlamydia treatment. Voices no concerns. Patient treated with Doxycycline  100mg  per S.O. by Dr JAYSON Helling, MD. Patient educated to RTC for TOC in 3 months. All questions answered and verbalizes understanding.   Doyce CINDERELLA Shuck, RN

## 2024-03-11 ENCOUNTER — Ambulatory Visit
